# Patient Record
Sex: Male | Born: 1955 | Race: White | Hispanic: No | Marital: Married | Smoking: Never smoker
Health system: Southern US, Community
[De-identification: ages and names within clinical notes are randomized; demographics above are authoritative.]

## PROBLEM LIST (undated history)

## (undated) DIAGNOSIS — K219 Gastro-esophageal reflux disease without esophagitis: Secondary | ICD-10-CM

## (undated) DIAGNOSIS — Z789 Other specified health status: Secondary | ICD-10-CM

## (undated) HISTORY — DX: Other specified health status: Z78.9

---

## 2008-01-18 HISTORY — PX: FINGER SURGERY: SHX640

## 2008-10-04 ENCOUNTER — Emergency Department: Payer: Self-pay | Admitting: Emergency Medicine

## 2018-08-21 ENCOUNTER — Inpatient Hospital Stay
Admission: EM | Admit: 2018-08-21 | Discharge: 2018-08-27 | DRG: 372 | Disposition: A | Discharge status: Home / Self Care | Payer: No Typology Code available for payment source | Attending: General Surgery | Admitting: General Surgery

## 2018-08-21 ENCOUNTER — Inpatient Hospital Stay: Payer: No Typology Code available for payment source

## 2018-08-21 ENCOUNTER — Emergency Department: Payer: No Typology Code available for payment source

## 2018-08-21 ENCOUNTER — Encounter: Payer: Self-pay | Admitting: Emergency Medicine

## 2018-08-21 ENCOUNTER — Other Ambulatory Visit: Payer: Self-pay

## 2018-08-21 DIAGNOSIS — K567 Ileus, unspecified: Secondary | ICD-10-CM | POA: Diagnosis present

## 2018-08-21 DIAGNOSIS — K8020 Calculus of gallbladder without cholecystitis without obstruction: Secondary | ICD-10-CM | POA: Diagnosis present

## 2018-08-21 DIAGNOSIS — K529 Noninfective gastroenteritis and colitis, unspecified: Secondary | ICD-10-CM | POA: Diagnosis present

## 2018-08-21 DIAGNOSIS — R1030 Lower abdominal pain, unspecified: Secondary | ICD-10-CM | POA: Diagnosis present

## 2018-08-21 DIAGNOSIS — K3532 Acute appendicitis with perforation and localized peritonitis, without abscess: Secondary | ICD-10-CM | POA: Diagnosis not present

## 2018-08-21 DIAGNOSIS — K381 Appendicular concretions: Secondary | ICD-10-CM | POA: Diagnosis present

## 2018-08-21 DIAGNOSIS — K760 Fatty (change of) liver, not elsewhere classified: Secondary | ICD-10-CM | POA: Diagnosis present

## 2018-08-21 DIAGNOSIS — I2510 Atherosclerotic heart disease of native coronary artery without angina pectoris: Secondary | ICD-10-CM | POA: Diagnosis present

## 2018-08-21 DIAGNOSIS — Z20828 Contact with and (suspected) exposure to other viral communicable diseases: Secondary | ICD-10-CM | POA: Diagnosis present

## 2018-08-21 DIAGNOSIS — K3533 Acute appendicitis with perforation and localized peritonitis, with abscess: Secondary | ICD-10-CM | POA: Diagnosis present

## 2018-08-21 LAB — CBC WITH DIFFERENTIAL/PLATELET
Abs Immature Granulocytes: 0.04 10*3/uL (ref 0.00–0.07)
Basophils Absolute: 0 10*3/uL (ref 0.0–0.1)
Basophils Relative: 0 %
Eosinophils Absolute: 0 10*3/uL (ref 0.0–0.5)
Eosinophils Relative: 0 %
HCT: 50.9 % (ref 39.0–52.0)
Hemoglobin: 17.8 g/dL — ABNORMAL HIGH (ref 13.0–17.0)
Immature Granulocytes: 0 %
Lymphocytes Relative: 8 %
Lymphs Abs: 0.7 10*3/uL (ref 0.7–4.0)
MCH: 32.1 pg (ref 26.0–34.0)
MCHC: 35 g/dL (ref 30.0–36.0)
MCV: 91.9 fL (ref 80.0–100.0)
Monocytes Absolute: 0.5 10*3/uL (ref 0.1–1.0)
Monocytes Relative: 5 %
Neutro Abs: 8 10*3/uL — ABNORMAL HIGH (ref 1.7–7.7)
Neutrophils Relative %: 87 %
Platelets: 221 10*3/uL (ref 150–400)
RBC: 5.54 MIL/uL (ref 4.22–5.81)
RDW: 12.2 % (ref 11.5–15.5)
WBC: 9.3 10*3/uL (ref 4.0–10.5)
nRBC: 0 % (ref 0.0–0.2)

## 2018-08-21 LAB — URINALYSIS, COMPLETE (UACMP) WITH MICROSCOPIC
Bacteria, UA: NONE SEEN
Bilirubin Urine: NEGATIVE
Glucose, UA: NEGATIVE mg/dL
Hgb urine dipstick: NEGATIVE
Ketones, ur: 5 mg/dL — AB
Leukocytes,Ua: NEGATIVE
Nitrite: NEGATIVE
Protein, ur: 30 mg/dL — AB
Specific Gravity, Urine: >1.046 — ABNORMAL HIGH (ref 1.005–1.030)
pH: 5 (ref 5.0–8.0)

## 2018-08-21 LAB — COMPREHENSIVE METABOLIC PANEL
ALT: 26 U/L (ref 0–44)
AST: 22 U/L (ref 15–41)
Albumin: 4.2 g/dL (ref 3.5–5.0)
Alkaline Phosphatase: 54 U/L (ref 38–126)
Anion gap: 14 (ref 5–15)
BUN: 34 mg/dL — ABNORMAL HIGH (ref 8–23)
CO2: 26 mmol/L (ref 22–32)
Calcium: 9.3 mg/dL (ref 8.9–10.3)
Chloride: 91 mmol/L — ABNORMAL LOW (ref 98–111)
Creatinine, Ser: 1.05 mg/dL (ref 0.61–1.24)
GFR calc Af Amer: >60 mL/min (ref >60)
GFR calc non Af Amer: >60 mL/min (ref >60)
Glucose, Bld: 180 mg/dL — ABNORMAL HIGH (ref 70–99)
Potassium: 3.6 mmol/L (ref 3.5–5.1)
Sodium: 131 mmol/L — ABNORMAL LOW (ref 135–145)
Total Bilirubin: 1.5 mg/dL — ABNORMAL HIGH (ref 0.3–1.2)
Total Protein: 8.5 g/dL — ABNORMAL HIGH (ref 6.5–8.1)

## 2018-08-21 LAB — SARS CORONAVIRUS 2 BY RT PCR (HOSPITAL ORDER, PERFORMED IN ~~LOC~~ HOSPITAL LAB): SARS Coronavirus 2: NEGATIVE

## 2018-08-21 LAB — LIPASE, BLOOD: Lipase: 19 U/L (ref 11–51)

## 2018-08-21 MED ORDER — PANTOPRAZOLE SODIUM 40 MG IV SOLR
40.0000 mg | INTRAVENOUS | Status: DC
  Administered 2018-08-21 – 2018-08-23 (×3): 40 mg via INTRAVENOUS
  Filled 2018-08-21 (×3): qty 40

## 2018-08-21 MED ORDER — KETOROLAC TROMETHAMINE 30 MG/ML IJ SOLN
30.0000 mg | Freq: Four times a day (QID) | INTRAMUSCULAR | Status: AC
  Administered 2018-08-21 – 2018-08-26 (×17): 30 mg via INTRAVENOUS
  Filled 2018-08-21 (×18): qty 1

## 2018-08-21 MED ORDER — MIDAZOLAM HCL 5 MG/5ML IJ SOLN
INTRAMUSCULAR | Status: AC
  Filled 2018-08-21: qty 5

## 2018-08-21 MED ORDER — FENTANYL CITRATE (PF) 100 MCG/2ML IJ SOLN
INTRAMUSCULAR | Status: AC
  Filled 2018-08-21: qty 2

## 2018-08-21 MED ORDER — DEXTROSE IN LACTATED RINGERS 5 % IV SOLN
INTRAVENOUS | Status: DC
  Administered 2018-08-21 – 2018-08-24 (×8): via INTRAVENOUS

## 2018-08-21 MED ORDER — ENOXAPARIN SODIUM 40 MG/0.4ML ~~LOC~~ SOLN
40.0000 mg | SUBCUTANEOUS | Status: DC
  Administered 2018-08-22 – 2018-08-26 (×5): 40 mg via SUBCUTANEOUS
  Filled 2018-08-21 (×6): qty 0.4

## 2018-08-21 MED ORDER — PIPERACILLIN-TAZOBACTAM 3.375 G IVPB 30 MIN
3.3750 g | Freq: Once | INTRAVENOUS | Status: AC
  Administered 2018-08-21: 3.375 g via INTRAVENOUS
  Filled 2018-08-21: qty 50

## 2018-08-21 MED ORDER — ACETAMINOPHEN 650 MG RE SUPP
650.0000 mg | Freq: Four times a day (QID) | RECTAL | Status: DC | PRN
  Filled 2018-08-21: qty 1

## 2018-08-21 MED ORDER — SODIUM CHLORIDE 0.9 % IV BOLUS
1000.0000 mL | Freq: Once | INTRAVENOUS | Status: AC
  Administered 2018-08-21: 1000 mL via INTRAVENOUS

## 2018-08-21 MED ORDER — IOHEXOL 300 MG/ML  SOLN
100.0000 mL | Freq: Once | INTRAMUSCULAR | Status: AC | PRN
  Administered 2018-08-21: 05:00:00 100 mL via INTRAVENOUS

## 2018-08-21 MED ORDER — ACETAMINOPHEN 325 MG PO TABS
650.0000 mg | ORAL_TABLET | Freq: Four times a day (QID) | ORAL | Status: DC | PRN
  Administered 2018-08-21 – 2018-08-27 (×5): 650 mg via ORAL
  Filled 2018-08-21 (×5): qty 2

## 2018-08-21 MED ORDER — PIPERACILLIN-TAZOBACTAM 3.375 G IVPB
3.3750 g | Freq: Three times a day (TID) | INTRAVENOUS | Status: DC
  Administered 2018-08-21 – 2018-08-26 (×15): 3.375 g via INTRAVENOUS
  Filled 2018-08-21 (×17): qty 50

## 2018-08-21 MED ORDER — HYDROMORPHONE HCL 1 MG/ML IJ SOLN
0.5000 mg | INTRAMUSCULAR | Status: DC | PRN
  Administered 2018-08-22 – 2018-08-24 (×6): 0.5 mg via INTRAVENOUS
  Filled 2018-08-21 (×6): qty 0.5

## 2018-08-21 MED ORDER — FENTANYL CITRATE (PF) 100 MCG/2ML IJ SOLN
INTRAMUSCULAR | Status: DC | PRN
  Administered 2018-08-21 (×2): 50 ug via INTRAVENOUS

## 2018-08-21 MED ORDER — MIDAZOLAM HCL 2 MG/2ML IJ SOLN
INTRAMUSCULAR | Status: DC | PRN
  Administered 2018-08-21 (×2): 1 mg via INTRAVENOUS

## 2018-08-21 MED ORDER — ONDANSETRON HCL 4 MG/2ML IJ SOLN
4.0000 mg | Freq: Once | INTRAMUSCULAR | Status: AC
  Administered 2018-08-21: 4 mg via INTRAVENOUS
  Filled 2018-08-21: qty 2

## 2018-08-21 MED ORDER — KETOROLAC TROMETHAMINE 30 MG/ML IJ SOLN
30.0000 mg | Freq: Four times a day (QID) | INTRAMUSCULAR | Status: DC
  Administered 2018-08-21: 09:00:00 30 mg via INTRAVENOUS
  Filled 2018-08-21: qty 1

## 2018-08-21 MED ORDER — SODIUM CHLORIDE 0.9% FLUSH
5.0000 mL | Freq: Three times a day (TID) | INTRAVENOUS | Status: DC
  Administered 2018-08-21 – 2018-08-27 (×18): 5 mL

## 2018-08-21 MED ORDER — FENTANYL CITRATE (PF) 100 MCG/2ML IJ SOLN
50.0000 ug | Freq: Once | INTRAMUSCULAR | Status: AC
  Administered 2018-08-21: 05:00:00 50 ug via INTRAVENOUS
  Filled 2018-08-21: qty 2

## 2018-08-21 NOTE — H&P (Addendum)
[facility] SURGICAL HISTORY & PHYSICAL (cpt [phone])  HISTORY OF PRESENT ILLNESS (HPI):  63 y.o. male presented [facility] ED overnight for abdominal pain. Patient reports about a 3 day history of diffuse pain across his lower abdomen. He described this as being crampy in nature and rated it at the worst a 9 out of 10. Nothing seems to make the pain better. He endorses associate nausea, emesis, and decreased appetite with the pain. No fever, chills, chest pain, SOB, cough, congestion, or bladder/bowel changes. No history of similar pain in the past. No previous abdominal surgeries. Work up in the ED was concerning for perforated appendicitis with abscess and likely ileus.   General surgery is consulted by emergency medicine physician Dr Nita Sicklearolina Veronese, MD for evaluation and management of perforated appendicitis.    PAST MEDICAL HISTORY (PMH):  History reviewed. No pertinent past medical history.  Reviewed. Otherwise negative.   PAST SURGICAL HISTORY (PSH):  History reviewed. No pertinent surgical history.  Reviewed. Otherwise negative.   MEDICATIONS:  Prior to Admission medications   Not on File     ALLERGIES:  No Known Allergies   SOCIAL HISTORY:  Social History   Socioeconomic History  . Marital status: Married    Spouse name: Not on file  . Number of children: Not on file  . Years of education: Not on file  . Highest education level: Not on file  Occupational History  . Not on file  Social Needs  . Financial resource strain: Not on file  . Food insecurity    Worry: Not on file    Inability: Not on file  . Transportation needs    Medical: Not on file    Non-medical: Not on file  Tobacco Use  . Smoking status: Not on file  Substance and Sexual Activity  . Alcohol use: Not on file  . Drug use: Not on file  . Sexual activity: Not on file  Lifestyle  . Physical activity    Days per week: Not on file    Minutes per session: Not on file  . Stress: Not  on file  Relationships  . Social Musicianconnections    Talks on phone: Not on file    Gets together: Not on file    Attends religious service: Not on file    Active member of club or organization: Not on file    Attends meetings of clubs or organizations: Not on file    Relationship status: Not on file  . Intimate partner violence    Fear of current or ex partner: Not on file    Emotionally abused: Not on file    Physically abused: Not on file    Forced sexual activity: Not on file  Other Topics Concern  . Not on file  Social History Narrative  . Not on file     FAMILY HISTORY:  No family history on file.  Otherwise negative.   REVIEW OF SYSTEMS:  Review of Systems  Constitutional: Negative for chills and fever.  HENT: Negative for congestion and sore throat.   Respiratory: Negative for cough and shortness of breath.   Cardiovascular: Negative for chest pain and palpitations.  Gastrointestinal: Positive for abdominal pain, nausea and vomiting. Negative for blood in stool, constipation and diarrhea.  Genitourinary: Negative for dysuria and urgency.  Neurological: Negative for dizziness and headaches.  All other systems reviewed and are negative.   VITAL SIGNS:  Temp:  [97.8 F (36.6 C)] 97.8 F (36.6  C) (08/04 0442) Pulse Rate:  [91-116] 92 (08/04 0723) Resp:  [18-27] 18 (08/04 0723) BP: (132-136)/(74-90) 133/74 (08/04 0723) SpO2:  [93 %-100 %] 100 % (08/04 0723) Weight:  [95.3 kg] 95.3 kg (08/04 0443)     Height: 6\' 1"  (185.4 cm) Weight: 95.3 kg BMI (Calculated): 27.71   PHYSICAL EXAM:  Physical Exam Vitals signs and nursing note reviewed.  Constitutional:      General: He is not in acute distress.    Appearance: He is well-developed and normal weight. He is not ill-appearing or toxic-appearing.  HENT:     Head: Normocephalic and atraumatic.  Eyes:     General: No scleral icterus.    Extraocular Movements: Extraocular movements intact.  Cardiovascular:     Rate and  Rhythm: Normal rate and regular rhythm.     Heart sounds: Normal heart sounds. No murmur. No friction rub. No gallop.   Pulmonary:     Effort: Pulmonary effort is normal. No respiratory distress.     Breath sounds: Normal breath sounds. No wheezing.  Abdominal:     General: Abdomen is flat. Bowel sounds are normal. There is no distension.     Palpations: Abdomen is soft.     Tenderness: There is abdominal tenderness in the right lower quadrant. There is no guarding or rebound. Positive signs include McBurney's sign. Negative signs include Murphy's sign and Rovsing's sign.     Hernia: No hernia is present.     Comments: No peritonitis  Genitourinary:    Comments: Deferred Skin:    General: Skin is warm and dry.     Coloration: Skin is not jaundiced or pale.  Neurological:     General: No focal deficit present.     Mental Status: He is alert and oriented to person, place, and time.  Psychiatric:        Mood and Affect: Mood normal.        Behavior: Behavior normal.     INTAKE/OUTPUT:  This shift: No intake/output data recorded.  Last 2 shifts: @IOLAST2SHIFTS @  Labs:  CBC Latest Ref Rng & Units 08/21/2018  WBC 4.0 - 10.5 K/uL 9.3  Hemoglobin 13.0 - 17.0 g/dL 17.8(H)  Hematocrit 39.0 - 52.0 % 50.9  Platelets 150 - 400 K/uL 221   CMP Latest Ref Rng & Units 08/21/2018  Glucose 70 - 99 mg/dL 098(J180(H)  BUN 8 - 23 mg/dL 19(J34(H)  Creatinine 4.780.61 - 1.24 mg/dL 2.951.05  Sodium 621135 - 308145 mmol/L 131(L)  Potassium 3.5 - 5.1 mmol/L 3.6  Chloride 98 - 111 mmol/L 91(L)  CO2 22 - 32 mmol/L 26  Calcium 8.9 - 10.3 mg/dL 9.3  Total Protein 6.5 - 8.1 g/dL 6.5(H8.5(H)  Total Bilirubin 0.3 - 1.2 mg/dL 8.4(O1.5(H)  Alkaline Phos 38 - 126 U/L 54  AST 15 - 41 U/L 22  ALT 0 - 44 U/L 26    Imaging studies:   CT Abdomen/Pelvis (08/21/2018) personally reviewed showing appendicitis with adjacent abscess, small amount of free air, and small bowel dilation most likely ileus from infection. Radiologist report also  reviewed:   IMPRESSION: 1. Perforated appendicitis with small volume pneumoperitoneum in the right lower quadrant and 5 cm collection/abscess in the low pelvis. Appendicolith is present. 2. Secondary enteritis in the right lower quadrant with small bowel dilatation from partial obstruction or ileus. 3. Hepatic steatosis, cholelithiasis, and coronary atherosclerosis.   Assessment/Plan: (ICD-10's: K35.32, K56.7) 63 y.o. male with lower abdominal pain, nausea, and emesis attributable to perforated appendicitis with  5 cm adjacent abscess and likely ileus secondary to infection given small bowel dilation on imaging.   - Admit to general surgery   - NPO + IVF  - IV ABx (Zosyn)  - pain control prn; antiemetics prn  - Serial abdominal examinations  - He does not appear peritonitic or septic at this time. Will attempt conservative management of perforated appendicitis at this time. Patient and his wife are agreeable and understand if he clinically deteriorates he may require more emergent surgical intervention this admission.     - will ask IR to attempt percutaneous drainage of intra-abdominal abscess  - given ileus, if nausea/emesis become an issue we can place NGT; hold off for now.  - medical management of co-morbidities  - DVT prophylaxis; hold for potential IR procedure  All of the above findings and recommendations were discussed with the patient and his family, and all of their questions were answered to their expressed satisfaction.  -- Edison Simon, PA-C [facility] 08/21/2018, 7:31 AM [phone] M-F: 7am - 4pm   I saw and evaluated the patient. At the time of my exam, he was endorsing reflux symptoms. I added a PPI to his regimen. Still awaiting a floor bed. I agree with the above documentation, exam, and plan, which I have edited where appropriate. Fredirick Maudlin  1:24 PM

## 2018-08-21 NOTE — Procedures (Signed)
Interventional Radiology Procedure:   Indications: Perforated appendicitis with pelvic abscess  Procedure: CT guided abscess drain placement  Findings: Placed 10 Fr in pelvic abscess from left transgluteal approach.  Removed 25 ml of yellow purulent fluid.   Complications: None     EBL: less than 30 ml  Plan: Send fluid for culture and follow output.     Joseph Tolosa R. Anselm Pancoast, MD  Pager: [phone]

## 2018-08-21 NOTE — ED Notes (Signed)
ED TO INPATIENT HANDOFF REPORT  ED Nurse Name and Phone #: Ignatius Specking [phone]  S Name/Age/Gender Joseph Hodges 63 y.o. male Room/Bed: EDOTFA/EDOTF  Code Status   Code Status: Full Code  Home/SNF/Other Home Patient oriented to: self, place, time and situation Is this baseline? Yes   Triage Complete: Triage complete  Chief Complaint Vomiting Abd Pain  Triage Note Pt to triage via w/c with no distress noted, mask in place; pt reports lower abd pain accomp by N/V since Friday   Allergies No Known Allergies  Level of Care/Admitting Diagnosis ED Disposition    ED Disposition Condition [place]: [facility] [100120]  Level of Care: Med-Surg [16]  Covid Evaluation: Asymptomatic Screening Protocol (No Symptoms)  Diagnosis: Acute perforated appendicitis [867619]  Admitting Physician: Darreld Mclean  Attending Physician: Darreld Mclean  Estimated length of stay: 3 - 4 days  Certification:: I certify this patient will need inpatient services for at least 2 midnights  Bed request comments: 2C, if available  PT Class (Do Not Modify): Inpatient [101]  PT Acc Code (Do Not Modify): Private [1]       B Medical/Surgery History History reviewed. No pertinent past medical history. History reviewed. No pertinent surgical history.   A IV Location/Drains/Wounds Patient Lines/Drains/Airways Status   Active Line/Drains/Airways    Name:   Placement date:   Placement time:   Site:   Days:   Peripheral IV 08/21/18 Left Antecubital   08/21/18    0505    Antecubital   less than 1          Intake/Output Last 24 hours  Intake/Output Summary (Last 24 hours) at 08/21/2018 1442 Last data filed at 08/21/2018 1309 Gross per 24 hour  Intake no documentation  Output 200 ml  Net -200 ml    Labs/Imaging Results for orders placed or performed during the hospital encounter of 08/21/18 (from the past 48 hour(s))  CBC with  Differential     Status: Abnormal   Collection Time: 08/21/18  4:46 AM  Result Value Ref Range   WBC 9.3 4.0 - 10.5 K/uL   RBC 5.54 4.22 - 5.81 MIL/uL   Hemoglobin 17.8 (H) 13.0 - 17.0 g/dL   HCT 50.9 39.0 - 52.0 %   MCV 91.9 80.0 - 100.0 fL   MCH 32.1 26.0 - 34.0 pg   MCHC 35.0 30.0 - 36.0 g/dL   RDW 12.2 11.5 - 15.5 %   Platelets 221 150 - 400 K/uL   nRBC 0.0 0.0 - 0.2 %   Neutrophils Relative % 87 %   Neutro Abs 8.0 (H) 1.7 - 7.7 K/uL   Lymphocytes Relative 8 %   Lymphs Abs 0.7 0.7 - 4.0 K/uL   Monocytes Relative 5 %   Monocytes Absolute 0.5 0.1 - 1.0 K/uL   Eosinophils Relative 0 %   Eosinophils Absolute 0.0 0.0 - 0.5 K/uL   Basophils Relative 0 %   Basophils Absolute 0.0 0.0 - 0.1 K/uL   Immature Granulocytes 0 %   Abs Immature Granulocytes 0.04 0.00 - 0.07 K/uL    Comment: Performed at [facility], [address] [place], [place] [address]  Comprehensive metabolic panel     Status: Abnormal   Collection Time: 08/21/18  4:46 AM  Result Value Ref Range   Sodium 131 (L) 135 - 145 mmol/L   Potassium 3.6 3.5 - 5.1 mmol/L   Chloride 91 (L) 98 - 111 mmol/L  CO2 26 22 - 32 mmol/L   Glucose, Bld 180 (H) 70 - 99 mg/dL   BUN 34 (H) 8 - 23 mg/dL   Creatinine, Ser 1.611.05 0.61 - 1.24 mg/dL   Calcium 9.3 8.9 - 09.610.3 mg/dL   Total Protein 8.5 (H) 6.5 - 8.1 g/dL   Albumin 4.2 3.5 - 5.0 g/dL   AST 22 15 - 41 U/L   ALT 26 0 - 44 U/L   Alkaline Phosphatase 54 38 - 126 U/L   Total Bilirubin 1.5 (H) 0.3 - 1.2 mg/dL   GFR calc non Af Amer >60 >60 mL/min   GFR calc Af Amer >60 >60 mL/min   Anion gap 14 5 - 15    Comment: Performed at [facility], [address] [place], [place] [address]  Lipase, blood     Status: None   Collection Time: 08/21/18  4:46 AM  Result Value Ref Range   Lipase 19 11 - 51 U/L    Comment: Performed at [facility], [address] [place], [place] [address]  Urinalysis, Complete w Microscopic     Status: Abnormal    Collection Time: 08/21/18  4:46 AM  Result Value Ref Range   Color, Urine YELLOW (A) YELLOW   APPearance CLEAR (A) CLEAR   Specific Gravity, Urine >1.046 (H) 1.005 - 1.030   pH 5.0 5.0 - 8.0   Glucose, UA NEGATIVE NEGATIVE mg/dL   Hgb urine dipstick NEGATIVE NEGATIVE   Bilirubin Urine NEGATIVE NEGATIVE   Ketones, ur 5 (A) NEGATIVE mg/dL   Protein, ur 30 (A) NEGATIVE mg/dL   Nitrite NEGATIVE NEGATIVE   Leukocytes,Ua NEGATIVE NEGATIVE   RBC / HPF 0-5 0 - 5 RBC/hpf   WBC, UA 0-5 0 - 5 WBC/hpf   Bacteria, UA NONE SEEN NONE SEEN   Squamous Epithelial / LPF 0-5 0 - 5   Mucus PRESENT     Comment: Performed at [facility], [address] [place], [place] [address]  SARS Coronavirus 2 [facility](Hospital order, Performed in [facility] hospital lab) Nasopharyngeal Nasopharyngeal Swab     Status: None   Collection Time: 08/21/18  6:16 AM   Specimen: Nasopharyngeal Swab  Result Value Ref Range   SARS Coronavirus 2 NEGATIVE NEGATIVE    Comment: (NOTE) If result is NEGATIVE SARS-CoV-2 target nucleic acids are NOT DETECTED. The SARS-CoV-2 RNA is generally detectable in upper and lower  respiratory specimens during the acute phase of infection. The lowest  concentration of SARS-CoV-2 viral copies this assay can detect is 250  copies / mL. A negative result does not preclude SARS-CoV-2 infection  and should not be used as the sole basis for treatment or other  patient management decisions.  A negative result may occur with  improper specimen collection / handling, submission of specimen other  than nasopharyngeal swab, presence of viral mutation(s) within the  areas targeted by this assay, and inadequate number of viral copies  (<250 copies / mL). A negative result must be combined with clinical  observations, patient history, and epidemiological information. If result is POSITIVE SARS-CoV-2 target nucleic acids are DETECTED. The SARS-CoV-2 RNA is generally detectable in upper and lower   respiratory specimens dur ing the acute phase of infection.  Positive  results are indicative of active infection with SARS-CoV-2.  Clinical  correlation with patient history and other diagnostic information is  necessary to determine patient infection status.  Positive results do  not rule out bacterial infection or co-infection  with other viruses. If result is PRESUMPTIVE POSTIVE SARS-CoV-2 nucleic acids MAY BE PRESENT.   A presumptive positive result was obtained on the submitted specimen  and confirmed on repeat testing.  While 2019 novel coronavirus  (SARS-CoV-2) nucleic acids may be present in the submitted sample  additional confirmatory testing may be necessary for epidemiological  and / or clinical management purposes  to differentiate between  SARS-CoV-2 and other Sarbecovirus currently known to infect humans.  If clinically indicated additional testing with an alternate test  methodology [phone]([id number]) is advised. The SARS-CoV-2 RNA is generally  detectable in upper and lower respiratory sp ecimens during the acute  phase of infection. The expected result is Negative. Fact Sheet for Patients:  BoilerBrush.com.cyhttps://www.fda.gov/media/136312/download Fact Sheet for Healthcare Providers: https://pope.com/https://www.fda.gov/media/136313/download This test is not yet approved or cleared by the [place] FDA and has been authorized for detection and/or diagnosis of SARS-CoV-2 by FDA under an Emergency Use Authorization (EUA).  This EUA will remain in effect (meaning this test can be used) for the duration of the COVID-19 declaration under Section 564(b)(1) of the Act, 21 U.S.C. section 360bbb-3(b)(1), unless the authorization is terminated or revoked sooner. Performed at [facility], [address] [place], [place] [address]    Ct Abdomen Pelvis W Contrast  Result Date: 08/21/2018 CLINICAL DATA:  Unspecified abdominal pain EXAM: CT ABDOMEN AND PELVIS WITH CONTRAST TECHNIQUE: Multidetector CT  imaging of the abdomen and pelvis was performed using the standard protocol following bolus administration of intravenous contrast. CONTRAST:  100mL OMNIPAQUE IOHEXOL 300 MG/ML  SOLN COMPARISON:  None. FINDINGS: Lower chest: Small sliding hiatal hernia. Atelectasis in the lower lungs. Coronary atherosclerosis. Hepatobiliary: Hepatic steatosis.Cholelithiasis without signs of acute cholecystitis. Pancreas: Unremarkable. Spleen: Unremarkable. Adrenals/Urinary Tract: Negative adrenals. No hydronephrosis or stone. Unremarkable bladder. Stomach/Bowel: Appendicolith with distended appendix showing indistinct wall. There is mesoappendix inflammation. Outer wall appendiceal diameter is 12 mm. The appendix extends inferiorly from the cecum in the right lower quadrant. Small volume pneumoperitoneum in the right lower quadrant. There is a gas and fluid collection in the recto prosthetic recess measuring 5.3 cm in maximum. Right lower quadrant bowel loops show mild circumferential wall thickening and there is generalized small bowel distension which could be partial obstruction or reactive ileus. No defect is seen within the ileal wall to implicate a primary small bowel process. Vascular/Lymphatic: No acute vascular abnormality. No mass or adenopathy. Reproductive:Negative. Musculoskeletal: Degenerative disease without acute finding. These results were called by telephone at the time of interpretation on 08/21/2018 at 5:58 am to Dr. Nita SickleAROLINA VERONESE , who verbally acknowledged these results. IMPRESSION: 1. Perforated appendicitis with small volume pneumoperitoneum in the right lower quadrant and 5 cm collection/abscess in the low pelvis. Appendicolith is present. 2. Secondary enteritis in the right lower quadrant with small bowel dilatation from partial obstruction or ileus. 3. Hepatic steatosis, cholelithiasis, and coronary atherosclerosis. Electronically Signed   By: Marnee SpringJonathon  Watts M.D.   On: 08/21/2018 06:01    Pending  Labs Unresulted Labs (From admission, onward)    Start     Ordered   08/28/18 0500  Creatinine, serum  (enoxaparin (LOVENOX)    CrCl >/= 30 ml/min)  Weekly,   STAT    Comments: while on enoxaparin therapy    08/21/18 0645   08/22/18 0500  Basic metabolic panel  Tomorrow morning,   STAT     08/21/18 0645   08/22/18 0500  Magnesium  Tomorrow morning,   STAT     08/21/18 0645  08/22/18 0500  Phosphorus  Tomorrow morning,   STAT     08/21/18 0645   08/22/18 0500  CBC  Tomorrow morning,   STAT     08/21/18 0645   08/21/18 0642  HIV antibody (Routine Testing)  Once,   STAT     08/21/18 0645          Vitals/Pain Today's Vitals   08/21/18 1416 08/21/18 1422 08/21/18 1427 08/21/18 1432  BP: 125/86 126/88 124/90 125/88  Pulse: 82 90 88 88  Resp: (Abnormal) 32 (Abnormal) 34 (Abnormal) 27 (Abnormal) 24  Temp:      SpO2: 94% 97% 95% 94%  Weight:      Height:      PainSc:        Isolation Precautions No active isolations  Medications Medications  enoxaparin (LOVENOX) injection 40 mg (0 mg Subcutaneous Hold 08/21/18 0749)  dextrose 5 % in lactated ringers infusion ( Intravenous New Bag/Given 08/21/18 0837)  piperacillin-tazobactam (ZOSYN) IVPB 3.375 g (has no administration in time range)  acetaminophen (TYLENOL) tablet 650 mg (has no administration in time range)    Or  acetaminophen (TYLENOL) suppository 650 mg (has no administration in time range)  HYDROmorphone (DILAUDID) injection 0.5 mg (has no administration in time range)  pantoprazole (PROTONIX) injection 40 mg (40 mg Intravenous Given 08/21/18 1337)  ketorolac (TORADOL) 30 MG/ML injection 30 mg (has no administration in time range)  fentaNYL (SUBLIMAZE) 100 MCG/2ML injection (has no administration in time range)  midazolam (VERSED) 5 MG/5ML injection (has no administration in time range)  midazolam (VERSED) injection (1 mg Intravenous Given 08/21/18 1422)  fentaNYL (SUBLIMAZE) injection (50 mcg Intravenous Given 08/21/18 1422)   fentaNYL (SUBLIMAZE) injection 50 mcg (50 mcg Intravenous Given 08/21/18 0513)  ondansetron (ZOFRAN) injection 4 mg (4 mg Intravenous Given 08/21/18 0513)  sodium chloride 0.9 % bolus 1,000 mL (0 mLs Intravenous Stopped 08/21/18 0833)  iohexol (OMNIPAQUE) 300 MG/ML solution 100 mL (100 mLs Intravenous Contrast Given 08/21/18 0526)  piperacillin-tazobactam (ZOSYN) IVPB 3.375 g (0 g Intravenous Stopped 08/21/18 0659)    Mobility walks Low fall risk   Focused Assessments Cardiac Assessment Handoff:  Cardiac Rhythm: Normal sinus rhythm No results found for: CKTOTAL, CKMB, CKMBINDEX, TROPONINI No results found for: DDIMER Does the Patient currently have chest pain? No      R Recommendations: See Admitting Provider Note  Report given to:   Additional Notes: 0

## 2018-08-21 NOTE — ED Notes (Signed)
ED Provider at bedside. 

## 2018-08-21 NOTE — ED Notes (Signed)
Pt 1400 dose of zosyn sent with the pt for his procedure in specials.

## 2018-08-21 NOTE — Consult Note (Signed)
Chief Complaint: Patient was seen in consultation today for pelvic abscess aspiration/possible drain placement  Referring Physician(s): Lynden OxfordZachary Schulz, PA-C  Supervising Physician: Richarda OverlieHenn, Adam  Patient Status: [facility] - ED  History of Present Illness: Joseph QuailDarren Hodges is a 63 y.o. male with no significant past medical history who presented to [facility] ED early this morning with complaints of abdominal pain, nausea and vomiting x3 days. Initial workup in the ED showed patient afebrile without leukocytosis, COVID (-). CT abd/pelvis with contrast showed a perforated appendicitis with small volume pneumoperitoneum in the right lower quadrant and a 5 cm collection/absces in the low pelvis. General surgery was consulted who recommended IV fluid and antibiotics as well as IR consult for possible aspiration and drain placement of the pelvic fluid collection.  Patient laying in ED bed during exam, appears comfortable. Wife is present during exam. He reports he is overall very healthy, has no history of any chronic medical issues and only takes over the counter medications for headaches/muscle pain which is infrequent. He has not seen a PCP in quite awhile but plans to establish care once he is out of the hospital. He tells me he began having lower abdominal pain which was cramping/gas-like in nature on Friday. Shortly after developing abdominal pain he began to experience intermittent nausea and vomiting, he denies noting any blood in his vomit. He states that he has been trying to eat and drink however he is unable to keep anything down. He did have a small BM over the weekend, he denies melena or hematochezia. He is unsure if he has had a fever because he has been taking Tylenol around the clock to help with abdominal pain. He states he was told about possibly having a drain placed today. He and his wife are wondering when he will be able to go to a room on the floor, how long he will have to stay in the hospital  and how to care for a drain once he leaves the hospital.  History reviewed. No pertinent past medical history.  History reviewed. No pertinent surgical history.  Allergies: Patient has no known allergies.  Medications: Prior to Admission medications   Not on File     No family history on file.  Social History   Socioeconomic History   Marital status: Married    Spouse name: Not on file   Number of children: Not on file   Years of education: Not on file   Highest education level: Not on file  Occupational History   Not on file  Social Needs   Financial resource strain: Not on file   Food insecurity    Worry: Not on file    Inability: Not on file   Transportation needs    Medical: Not on file    Non-medical: Not on file  Tobacco Use   Smoking status: Not on file  Substance and Sexual Activity   Alcohol use: Not on file   Drug use: Not on file   Sexual activity: Not on file  Lifestyle   Physical activity    Days per week: Not on file    Minutes per session: Not on file   Stress: Not on file  Relationships   Social connections    Talks on phone: Not on file    Gets together: Not on file    Attends religious service: Not on file    Active member of club or organization: Not on file    Attends meetings of  clubs or organizations: Not on file    Relationship status: Not on file  Other Topics Concern   Not on file  Social History Narrative   Not on file     Review of Systems: A 12 point ROS discussed and pertinent positives are indicated in the HPI above.  All other systems are negative.  Review of Systems  Constitutional: Positive for appetite change. Negative for chills.  Respiratory: Negative for cough and shortness of breath.   Cardiovascular: Negative for chest pain and leg swelling.  Gastrointestinal: Positive for abdominal pain, nausea and vomiting. Negative for blood in stool, constipation and diarrhea.  Genitourinary: Negative for  hematuria.  Musculoskeletal: Negative for back pain.  Skin: Negative for rash.  Neurological: Positive for dizziness (when standing too fast and after receiving pain medication in ED). Negative for syncope and headaches.    Vital Signs: BP 133/74    Pulse 92    Temp 97.8 F (36.6 C)    Resp 18    Ht 6\' 1"  (1.854 m)    Wt 210 lb (95.3 kg)    SpO2 100%    BMI 27.71 kg/m   Physical Exam Vitals signs and nursing note reviewed.  Constitutional:      General: He is not in acute distress. HENT:     Head: Normocephalic.  Cardiovascular:     Rate and Rhythm: Normal rate and regular rhythm.  Pulmonary:     Effort: Pulmonary effort is normal.     Breath sounds: Normal breath sounds.  Abdominal:     General: There is no distension.     Palpations: Abdomen is soft.     Tenderness: There is abdominal tenderness (RLQ, RUQ, midline). There is no guarding.     Comments: Tense but soft abdomen. Decreased bowel sounds  Skin:    General: Skin is warm and dry.  Neurological:     Mental Status: He is alert and oriented to person, place, and time.  Psychiatric:        Mood and Affect: Mood normal.        Behavior: Behavior normal.        Thought Content: Thought content normal.        Judgment: Judgment normal.      MD Evaluation Airway: WNL Heart: WNL Abdomen: WNL Chest/ Lungs: WNL ASA  Classification: 2 Mallampati/Airway Score: Two   Imaging: Ct Abdomen Pelvis W Contrast  Result Date: 08/21/2018 CLINICAL DATA:  Unspecified abdominal pain EXAM: CT ABDOMEN AND PELVIS WITH CONTRAST TECHNIQUE: Multidetector CT imaging of the abdomen and pelvis was performed using the standard protocol following bolus administration of intravenous contrast. CONTRAST:  149mL OMNIPAQUE IOHEXOL 300 MG/ML  SOLN COMPARISON:  None. FINDINGS: Lower chest: Small sliding hiatal hernia. Atelectasis in the lower lungs. Coronary atherosclerosis. Hepatobiliary: Hepatic steatosis.Cholelithiasis without signs of acute  cholecystitis. Pancreas: Unremarkable. Spleen: Unremarkable. Adrenals/Urinary Tract: Negative adrenals. No hydronephrosis or stone. Unremarkable bladder. Stomach/Bowel: Appendicolith with distended appendix showing indistinct wall. There is mesoappendix inflammation. Outer wall appendiceal diameter is 12 mm. The appendix extends inferiorly from the cecum in the right lower quadrant. Small volume pneumoperitoneum in the right lower quadrant. There is a gas and fluid collection in the recto prosthetic recess measuring 5.3 cm in maximum. Right lower quadrant bowel loops show mild circumferential wall thickening and there is generalized small bowel distension which could be partial obstruction or reactive ileus. No defect is seen within the ileal wall to implicate a primary small bowel process. Vascular/Lymphatic:  No acute vascular abnormality. No mass or adenopathy. Reproductive:Negative. Musculoskeletal: Degenerative disease without acute finding. These results were called by telephone at the time of interpretation on 08/21/2018 at 5:58 am to Dr. Nita SickleAROLINA VERONESE , who verbally acknowledged these results. IMPRESSION: 1. Perforated appendicitis with small volume pneumoperitoneum in the right lower quadrant and 5 cm collection/abscess in the low pelvis. Appendicolith is present. 2. Secondary enteritis in the right lower quadrant with small bowel dilatation from partial obstruction or ileus. 3. Hepatic steatosis, cholelithiasis, and coronary atherosclerosis. Electronically Signed   By: Marnee SpringJonathon  Watts M.D.   On: 08/21/2018 06:01    Labs:  CBC: Recent Labs    08/21/18 0446  WBC 9.3  HGB 17.8*  HCT 50.9  PLT 221    COAGS: No results for input(s): INR, APTT in the last 8760 hours.  BMP: Recent Labs    08/21/18 0446  NA 131*  K 3.6  CL 91*  CO2 26  GLUCOSE 180*  BUN 34*  CALCIUM 9.3  CREATININE 1.05  GFRNONAA >60  GFRAA >60    LIVER FUNCTION TESTS: Recent Labs    08/21/18 0446  BILITOT  1.5*  AST 22  ALT 26  ALKPHOS 54  PROT 8.5*  ALBUMIN 4.2    TUMOR MARKERS: No results for input(s): AFPTM, CEA, CA199, CHROMGRNA in the last 8760 hours.  Assessment and Plan:   63 y/o M with no past medical history who presented to [facility] ED early this morning with c/o abdominal pain, nausea and vomiting which began Friday. CT abd/pelvis shows perforated appendicitis and a 5 cm fluid collection/abscess in the low pelvis - IR has been consulted for possible aspiration/drain placement within this fluid collection. Patient has been reviewed by Dr. Lowella DandyHenn who approves procedure for later today.  Patient reports a few sips of water upon arrival to the ED around 4:30 am today, no other PO intake besides that. He does not take any medications. Afebrile, WBC 9.3, hgb 17.8, plt 221.  Risks and benefits discussed with the patient including bleeding, infection, damage to adjacent structures, bowel perforation/fistula connection, and sepsis.  All of the patient's questions were answered, patient is agreeable to proceed.  Consent signed and in IR.  Thank you for this interesting consult.  I greatly enjoyed meeting Development worker, communityDarren Sinquefield and look forward to participating in their care.  A copy of this report was sent to the requesting provider on this date.  Electronically Signed: Villa HerbShannon A Amman Bartel, PA-C 08/21/2018, 9:37 AM   I spent a total of 40 Minutes  in face to face in clinical consultation, greater than 50% of which was counseling/coordinating care for pelvic abscess aspiration/possible drain placement.

## 2018-08-21 NOTE — ED Notes (Signed)
Patient states having lower abdominal pain 9/10. Per patient pain started on lower right side. During palpation patient tender across lower abdominal area.

## 2018-08-21 NOTE — ED Provider Notes (Signed)
[facility] Emergency Department Provider Note  ____________________________________________  Time seen: Approximately 5:05 AM  I have reviewed the triage vital signs and the nursing notes.   HISTORY  Chief Complaint Abdominal Pain   HPI Joseph Hodges is a 63 y.o. male with no significant past medical history who presents for evaluation of 3 days of abdominal pain, nausea and vomiting.  Patient is complaining of crampy 9 out of 10 pain located in the lower abdominal region associated with several daily episodes of nonbloody nonbilious emesis.  Patient has been unable to keep anything down.  No diarrhea.  Has had normal bowel movements.  No fever.  No dysuria or hematuria.  No chest pain, shortness of breath, or cough.  Patient denies any prior abdominal surgeries.  Denies any similar pain in the past.   PMH None - reviewed   Allergies Patient has no known allergies.  No family history on file.  Social History Smoking - no Alcohol - no Drugs - no  Review of Systems  Constitutional: Negative for fever. Eyes: Negative for visual changes. ENT: Negative for sore throat. Neck: No neck pain  Cardiovascular: Negative for chest pain. Respiratory: Negative for shortness of breath. Gastrointestinal: + lower abdominal pain, nausea, and vomiting. No diarrhea. Genitourinary: Negative for dysuria. Musculoskeletal: Negative for back pain. Skin: Negative for rash. Neurological: Negative for headaches, weakness or numbness. Psych: No SI or HI  ____________________________________________   PHYSICAL EXAM:  VITAL SIGNS: ED Triage Vitals  Enc Vitals Group     BP 08/21/18 0442 132/84     Pulse Rate 08/21/18 0442 (!) 116     Resp 08/21/18 0442 18     Temp 08/21/18 0442 97.8 F (36.6 C)     Temp src --      SpO2 08/21/18 0442 93 %     Weight 08/21/18 0443 210 lb (95.3 kg)     Height 08/21/18 0443 6\' 1"  (1.854 m)     Head Circumference --      Peak  Flow --      Pain Score 08/21/18 0443 9     Pain Loc --      Pain Edu? --      Excl. in GC? --     Constitutional: Alert and oriented. Well appearing and in no apparent distress. HEENT:      Head: Normocephalic and atraumatic.         Eyes: Conjunctivae are normal. Sclera is non-icteric.       Mouth/Throat: Mucous membranes are dry.       Neck: Supple with no signs of meningismus. Cardiovascular: Tachycardic with regular rhythm. No murmurs, gallops, or rubs. 2+ symmetrical distal pulses are present in all extremities. No JVD. Respiratory: Normal respiratory effort. Lungs are clear to auscultation bilaterally. No wheezes, crackles, or rhonchi.  Gastrointestinal: Soft, mildly diffuse tenderness throughout, and non distended with positive bowel sounds. No rebound or guarding. Genitourinary: No CVA tenderness. Musculoskeletal: Nontender with normal range of motion in all extremities. No edema, cyanosis, or erythema of extremities. Neurologic: Normal speech and language. Face is symmetric. Moving all extremities. No gross focal neurologic deficits are appreciated. Skin: Skin is warm, dry and intact. No rash noted. Psychiatric: Mood and affect are normal. Speech and behavior are normal.  ____________________________________________   LABS (all labs ordered are listed, but only abnormal results are displayed)  Labs Reviewed  CBC WITH DIFFERENTIAL/PLATELET - Abnormal; Notable for the following components:      Result  Value   Hemoglobin 17.8 (*)    Neutro Abs 8.0 (*)    All other components within normal limits  COMPREHENSIVE METABOLIC PANEL - Abnormal; Notable for the following components:   Sodium 131 (*)    Chloride 91 (*)    Glucose, Bld 180 (*)    BUN 34 (*)    Total Protein 8.5 (*)    Total Bilirubin 1.5 (*)    All other components within normal limits  SARS CORONAVIRUS 2 (HOSPITAL ORDER, [place] [facility])  LIPASE, BLOOD  URINALYSIS, COMPLETE (UACMP) WITH  MICROSCOPIC  HIV ANTIBODY (ROUTINE TESTING W REFLEX)  CBC  CREATININE, SERUM   ____________________________________________  EKG  ED ECG REPORT I, Rudene Re, the attending physician, personally viewed and interpreted this ECG.  Normal sinus rhythm, rate of 92, normal intervals, normal axis, no ST elevations or depressions. ____________________________________________  RADIOLOGY  I have personally reviewed the images performed during this visit and I agree with the Radiologist's read.   Interpretation by Radiologist:  Ct Abdomen Pelvis W Contrast  Result Date: 08/21/2018 CLINICAL DATA:  Unspecified abdominal pain EXAM: CT ABDOMEN AND PELVIS WITH CONTRAST TECHNIQUE: Multidetector CT imaging of the abdomen and pelvis was performed using the standard protocol following bolus administration of intravenous contrast. CONTRAST:  176mL OMNIPAQUE IOHEXOL 300 MG/ML  SOLN COMPARISON:  None. FINDINGS: Lower chest: Small sliding hiatal hernia. Atelectasis in the lower lungs. Coronary atherosclerosis. Hepatobiliary: Hepatic steatosis.Cholelithiasis without signs of acute cholecystitis. Pancreas: Unremarkable. Spleen: Unremarkable. Adrenals/Urinary Tract: Negative adrenals. No hydronephrosis or stone. Unremarkable bladder. Stomach/Bowel: Appendicolith with distended appendix showing indistinct wall. There is mesoappendix inflammation. Outer wall appendiceal diameter is 12 mm. The appendix extends inferiorly from the cecum in the right lower quadrant. Small volume pneumoperitoneum in the right lower quadrant. There is a gas and fluid collection in the recto prosthetic recess measuring 5.3 cm in maximum. Right lower quadrant bowel loops show mild circumferential wall thickening and there is generalized small bowel distension which could be partial obstruction or reactive ileus. No defect is seen within the ileal wall to implicate a primary small bowel process. Vascular/Lymphatic: No acute vascular  abnormality. No mass or adenopathy. Reproductive:Negative. Musculoskeletal: Degenerative disease without acute finding. These results were called by telephone at the time of interpretation on 08/21/2018 at 5:58 am to Dr. Rudene Re , who verbally acknowledged these results. IMPRESSION: 1. Perforated appendicitis with small volume pneumoperitoneum in the right lower quadrant and 5 cm collection/abscess in the low pelvis. Appendicolith is present. 2. Secondary enteritis in the right lower quadrant with small bowel dilatation from partial obstruction or ileus. 3. Hepatic steatosis, cholelithiasis, and coronary atherosclerosis. Electronically Signed   By: Monte Fantasia M.D.   On: 08/21/2018 06:01      ____________________________________________   PROCEDURES  Procedure(s) performed: None Procedures Critical Care performed:  Yes  CRITICAL CARE Performed by: Rudene Re  ?  Total critical care time: 30 min  Critical care time was exclusive of separately billable procedures and treating other patients.  Critical care was necessary to treat or prevent imminent or life-threatening deterioration.  Critical care was time spent personally by me on the following activities: development of treatment plan with patient and/or surrogate as well as nursing, discussions with consultants, evaluation of patient's response to treatment, examination of patient, obtaining history from patient or surrogate, ordering and performing treatments and interventions, ordering and review of laboratory studies, ordering and review of radiographic studies, pulse oximetry and re-evaluation of patient's condition.  ____________________________________________   INITIAL IMPRESSION / ASSESSMENT AND PLAN / ED COURSE  63 y.o. male with no significant past medical history who presents for evaluation of 3 days of abdominal pain, nausea and vomiting.  Patient is well-appearing, looks dry on exam, afebrile, slightly  tachycardic with a pulse of 116, abdomen has mild diffuse tenderness with no localized tenderness, rebound or guarding, no distention, positive bowel sounds.  Differential diagnosis includes diverticulitis versus appendicitis versus SBO versus gallbladder disease versus pancreatitis.  Will give IV fluids, fentanyl, Zofran, will check labs, urinalysis, and get a CT abdomen pelvis.    _________________________ 6:03 AM on 08/21/2018 -----------------------------------------  CT concerning for perforated appendicitis with small abscess and free air. Will give zosyn. Discussed with Dr. Lady Garyannon for surgical intervention    As part of my medical decision making, I reviewed the following data within the electronic MEDICAL RECORD NUMBER Nursing notes reviewed and incorporated, Labs reviewed , Old chart reviewed, Radiograph reviewed , Discussed with admitting physician , Notes from prior ED visits and [place] Controlled Substance Database   Patient was evaluated in Emergency Department today for the symptoms described in the history of present illness. Patient was evaluated in the context of the global COVID-19 pandemic, which necessitated consideration that the patient might be at risk for infection with the SARS-CoV-2 virus that causes COVID-19. Institutional protocols and algorithms that pertain to the evaluation of patients at risk for COVID-19 are in a state of rapid change based on information released by regulatory bodies including the CDC and federal and state organizations. These policies and algorithms were followed during the patient's care in the ED.   ____________________________________________   FINAL CLINICAL IMPRESSION(S) / ED DIAGNOSES   Final diagnoses:  Acute perforated appendicitis      NEW MEDICATIONS STARTED DURING THIS VISIT:  ED Discharge Orders    None       Note:  This document was prepared using Dragon voice recognition software and may include unintentional dictation  errors.    Don PerkingVeronese, [place], MD 08/21/18 [phone]

## 2018-08-21 NOTE — ED Triage Notes (Signed)
Pt to triage via w/c with no distress noted, mask in place; pt reports lower abd pain accomp by N/V since Friday

## 2018-08-21 NOTE — Progress Notes (Signed)
Per surgery PA okay for RN to change diet order from Npo to clear liquids.

## 2018-08-22 LAB — CBC
HCT: 44.4 % (ref 39.0–52.0)
Hemoglobin: 15.3 g/dL (ref 13.0–17.0)
MCH: 32.8 pg (ref 26.0–34.0)
MCHC: 34.5 g/dL (ref 30.0–36.0)
MCV: 95.1 fL (ref 80.0–100.0)
Platelets: 181 10*3/uL (ref 150–400)
RBC: 4.67 MIL/uL (ref 4.22–5.81)
RDW: 12.5 % (ref 11.5–15.5)
WBC: 5 10*3/uL (ref 4.0–10.5)
nRBC: 0 % (ref 0.0–0.2)

## 2018-08-22 LAB — BASIC METABOLIC PANEL
Anion gap: 12 (ref 5–15)
BUN: 40 mg/dL — ABNORMAL HIGH (ref 8–23)
CO2: 25 mmol/L (ref 22–32)
Calcium: 8.2 mg/dL — ABNORMAL LOW (ref 8.9–10.3)
Chloride: 96 mmol/L — ABNORMAL LOW (ref 98–111)
Creatinine, Ser: 1.18 mg/dL (ref 0.61–1.24)
GFR calc Af Amer: >60 mL/min (ref >60)
GFR calc non Af Amer: >60 mL/min (ref >60)
Glucose, Bld: 128 mg/dL — ABNORMAL HIGH (ref 70–99)
Potassium: 4 mmol/L (ref 3.5–5.1)
Sodium: 133 mmol/L — ABNORMAL LOW (ref 135–145)

## 2018-08-22 LAB — HIV ANTIBODY (ROUTINE TESTING W REFLEX): HIV Screen 4th Generation wRfx: NONREACTIVE

## 2018-08-22 LAB — MAGNESIUM: Magnesium: 2.5 mg/dL — ABNORMAL HIGH (ref 1.7–2.4)

## 2018-08-22 LAB — PHOSPHORUS: Phosphorus: 2.6 mg/dL (ref 2.5–4.6)

## 2018-08-22 MED ORDER — ONDANSETRON HCL 4 MG/2ML IJ SOLN
4.0000 mg | Freq: Four times a day (QID) | INTRAMUSCULAR | Status: DC | PRN
  Administered 2018-08-22 (×2): 4 mg via INTRAVENOUS
  Filled 2018-08-22 (×2): qty 2

## 2018-08-22 NOTE — H&P (Signed)
[facility] SURGICAL HISTORY & PHYSICAL  HISTORY OF PRESENT ILLNESS (HPI):  63 y.o. male presented to [facility] ED Monday night for abdominal pain. Patient reports about a 3 day history of diffuse pain across his lower abdomen. He described this as being crampy in nature and rated it at the worst a 9 out of 10. Nothing seems to make the pain better. He endorses associate nausea, emesis, and decreased appetite with the pain. No fever, chills, chest pain, SOB, cough, congestion, or bladder/bowel changes. No history of similar pain in the past. No previous abdominal surgeries. Work up in the ED was concerning for perforated appendicitis with abscess and likely ileus.   Interval history: IR placed drain into abscess cavity.  Polymicrobial Gram stain.  Patient tolerating clear liquid diet, having loose stools.  Pain improved.    PAST MEDICAL HISTORY ([facility]):  History reviewed. No pertinent past medical history.  Reviewed. Otherwise negative.   PAST SURGICAL HISTORY ([place]):  History reviewed. No pertinent surgical history.  Reviewed. Otherwise negative.   MEDICATIONS:  Prior to Admission medications   Not on File     ALLERGIES:  No Known Allergies   SOCIAL HISTORY:  Social History   Socioeconomic History  . Marital status: Married    Spouse name: Not on file  . Number of children: Not on file  . Years of education: Not on file  . Highest education level: Not on file  Occupational History  . Not on file  Social Needs  . Financial resource strain: Not on file  . Food insecurity    Worry: Not on file    Inability: Not on file  . Transportation needs    Medical: Not on file    Non-medical: Not on file  Tobacco Use  . Smoking status: Never Smoker  . Smokeless tobacco: Never Used  Substance and Sexual Activity  . Alcohol use: Not on file  . Drug use: Not on file  . Sexual activity: Not on file  Lifestyle  . Physical activity    Days per week: Not on file    Minutes  per session: Not on file  . Stress: Not on file  Relationships  . Social Herbalist on phone: Not on file    Gets together: Not on file    Attends religious service: Not on file    Active member of club or organization: Not on file    Attends meetings of clubs or organizations: Not on file    Relationship status: Not on file  . Intimate partner violence    Fear of current or ex partner: Not on file    Emotionally abused: Not on file    Physically abused: Not on file    Forced sexual activity: Not on file  Other Topics Concern  . Not on file  Social History Narrative  . Not on file     FAMILY HISTORY:  No family history on file.  Otherwise negative.   REVIEW OF SYSTEMS:  Review of Systems  Constitutional: Negative for chills and fever.  HENT: Negative for congestion and sore throat.   Respiratory: Negative for cough and shortness of breath.   Cardiovascular: Negative for chest pain and palpitations.  Gastrointestinal: Positive for diarrhea. Negative for abdominal pain, blood in stool, constipation, nausea and vomiting.  Genitourinary: Negative for dysuria and urgency.  Neurological: Negative for dizziness and headaches.  All other systems reviewed and are negative.   VITAL SIGNS:  Temp:  [  98 F (36.7 C)-100.4 F (38 C)] 98 F (36.7 C) (08/05 0546) Pulse Rate:  [76-96] 76 (08/05 0546) Resp:  [16-34] 18 (08/05 0546) BP: (110-141)/(70-92) 110/74 (08/05 0546) SpO2:  [92 %-100 %] 100 % (08/05 0546)     Height: 6\' 1"  (185.4 cm) Weight: 95.3 kg BMI (Calculated): 27.71   PHYSICAL EXAM:  Physical Exam Vitals signs and nursing note reviewed.  Constitutional:      General: He is not in acute distress.    Appearance: He is well-developed and normal weight. He is not ill-appearing or toxic-appearing.  HENT:     Head: Normocephalic and atraumatic.  Eyes:     General: No scleral icterus.    Extraocular Movements: Extraocular movements intact.  Cardiovascular:      Rate and Rhythm: Normal rate and regular rhythm.     Heart sounds: Normal heart sounds. No murmur. No friction rub. No gallop.   Pulmonary:     Effort: Pulmonary effort is normal. No respiratory distress.     Breath sounds: Normal breath sounds. No wheezing.  Abdominal:     General: Abdomen is flat. Bowel sounds are normal. There is no distension.     Palpations: Abdomen is soft.     Tenderness: There is no abdominal tenderness. There is no guarding or rebound. Negative signs include Murphy's sign and Rovsing's sign.     Hernia: No hernia is present.     Comments: No peritonitis Drain with slightly murky serous fluid  Genitourinary:    Comments: Deferred Skin:    General: Skin is warm and dry.     Coloration: Skin is not jaundiced or pale.  Neurological:     General: No focal deficit present.     Mental Status: He is alert and oriented to person, place, and time.  Psychiatric:        Mood and Affect: Mood normal.        Behavior: Behavior normal.     INTAKE/OUTPUT:  This shift: No intake/output data recorded.  Last 2 shifts: @IOLAST2SHIFTS @  Labs:  CBC Latest Ref Rng & Units 08/22/2018 08/21/2018  WBC 4.0 - 10.5 K/uL 5.0 9.3  Hemoglobin 13.0 - 17.0 g/dL 96.015.3 17.8(H)  Hematocrit 39.0 - 52.0 % 44.4 50.9  Platelets 150 - 400 K/uL 181 221   CMP Latest Ref Rng & Units 08/22/2018 08/21/2018  Glucose 70 - 99 mg/dL 454(U128(H) 981(X180(H)  BUN 8 - 23 mg/dL 91(Y40(H) 78(G34(H)  Creatinine 0.61 - 1.24 mg/dL 9.561.18 2.131.05  Sodium 086135 - 145 mmol/L 133(L) 131(L)  Potassium 3.5 - 5.1 mmol/L 4.0 3.6  Chloride 98 - 111 mmol/L 96(L) 91(L)  CO2 22 - 32 mmol/L 25 26  Calcium 8.9 - 10.3 mg/dL 8.2(L) 9.3  Total Protein 6.5 - 8.1 g/dL - 8.5(H)  Total Bilirubin 0.3 - 1.2 mg/dL - 1.5(H)  Alkaline Phos 38 - 126 U/L - 54  AST 15 - 41 U/L - 22  ALT 0 - 44 U/L - 26    Imaging studies:   CT Abdomen/Pelvis (08/21/2018) personally reviewed showing appendicitis with adjacent abscess, small amount of free air, and small  bowel dilation most likely ileus from infection. Radiologist report also reviewed:   IMPRESSION: 1. Perforated appendicitis with small volume pneumoperitoneum in the right lower quadrant and 5 cm collection/abscess in the low pelvis. Appendicolith is present. 2. Secondary enteritis in the right lower quadrant with small bowel dilatation from partial obstruction or ileus. 3. Hepatic steatosis, cholelithiasis, and coronary atherosclerosis.   Assessment/Plan: (  ICD-10's: K35.32, 44K56.7) 63 y.o. male with lower abdominal pain, nausea, and emesis attributable to perforated appendicitis with 5 cm adjacent abscess and likely ileus secondary to infection given small bowel dilation on imaging.   - advance diet to full liquid  - decrease IVF to 75cc/h  - IV ABx (Zosyn)  - pain control prn; antiemetics prn  - Serial abdominal examinations  - He does not appear peritonitic or septic at this time. Will attempt conservative management of perforated appendicitis at this time. Patient and his wife are agreeable and understand if he clinically deteriorates he may require more emergent surgical intervention this admission.     - medical management of co-morbidities    All of the above findings and recommendations were discussed with the patient and his family, and all of their questions were answered to their expressed satisfaction.    Duanne GuessJennifer Rogerio Boutelle  9:01 AM

## 2018-08-22 NOTE — Progress Notes (Signed)
Patient ID: Joseph Hodges, male   DOB: [birth year], 63 y.o.   MRN: [id number]  IR Round note via phone  63 year old with perforated appendicitis and a pelvic abscess  IMPRESSION: 08/22/18: CT-guided placement of a transgluteal pelvic abscess drain.  Drain without OP per RN this am Flushing easily Site is clean and dry  NT  Pt not complaining of pain  Surgery has seen pt Will advance diet gradually Plan for another day or so in hospital  Will need to flush drain as OP daily 5-10 cc daily sterile saline Please give pt instructions for flushing Will need Rx for flushes He needs to record OP daily IR OP Clinic will call pt with follow up appt date and time Do not submerge drain site-- keep clean and dry

## 2018-08-22 NOTE — Progress Notes (Signed)
RN notified zack PA pt is complaining of nausea. Per PA okay for RN to place order for iv Zofran PRN Q6.

## 2018-08-23 ENCOUNTER — Inpatient Hospital Stay: Payer: No Typology Code available for payment source

## 2018-08-23 MED ORDER — PANTOPRAZOLE SODIUM 40 MG PO TBEC
40.0000 mg | DELAYED_RELEASE_TABLET | Freq: Every day | ORAL | Status: DC
  Administered 2018-08-24 – 2018-08-27 (×4): 40 mg via ORAL
  Filled 2018-08-23 (×4): qty 1

## 2018-08-23 MED ORDER — ENSURE ENLIVE PO LIQD
237.0000 mL | Freq: Two times a day (BID) | ORAL | Status: DC
  Administered 2018-08-24 – 2018-08-27 (×2): 237 mL via ORAL

## 2018-08-23 NOTE — Progress Notes (Signed)
Pt's wife at desk asking to speak with MD. Asked by this RN if she had a questions that nursing staff could answer. Wife stated that she wanted to speak with the doctor. Messaged the doctor via epic chat and MD stated that she had recently left the pt's room and would attempt to call room.

## 2018-08-23 NOTE — Progress Notes (Addendum)
Joseph Hodges, Joseph Hodges, Joseph Hodges, Joseph Hodges. He did report flatus and watery bowel movements yesterday but this has subside. He tolerated full liquids without issue. Is endorsing more burping today. Plans to mobilize more this afternoon. JP is now serous.   Review of Systems:  Constitutional: denies Hodges, Joseph Hodges  HEENT: denies cough Joseph congestion  Respiratory: denies any shortness of breath  Cardiovascular: denies chest pain Joseph palpitations  Gastrointestinal: + abdominal pain, distension (mild), nausea, denied emesis Genitourinary: denies burning with urination Joseph urinary frequency  Vital signs in last 24 hours: [min-max] current  Temp:  [97.9 F (36.6 C)-98.1 F (36.7 C)] 97.9 F (36.6 C) (08/06 0433) Pulse Rate:  [64-87] 64 (08/06 0433) Resp:  [16-20] 18 (08/06 0433) BP: (105-130)/(76-92) 130/92 (08/06 0433) SpO2:  [94 %-98 %] 94 % (08/06 0433)     Height: 6\' 1"  (185.4 cm) Weight: 95.3 kg BMI (Calculated): 27.71   Intake/Output last 2 shifts:  08/05 0701 - 08/06 0700 In: 1885.4 [I.V.:1823.8; IV Piggyback:51.6] Out: 522 [Urine:500; Drains:22]   Physical Exam:  Constitutional: alert, cooperative and Joseph distress  HENT: normocephalic without obvious abnormality  Eyes: PERRL, EOM's grossly intact and symmetric  Respiratory: breathing non-labored at rest  Cardiovascular: regular rate and sinus rhythm  Gastrointestinal: soft, non-tender, and mild distension, tympanic to percussion, JP in LLQ with serous output   Labs:  CBC Latest Ref Rng & Units 08/22/2018 08/21/2018  WBC 4.0 - 10.5 K/uL 5.0 9.3  Hemoglobin  13.0 - 17.0 g/dL 19.115.3 17.8(H)  Hematocrit 39.0 - 52.0 % 44.4 50.9  Platelets 150 - 400 K/uL 181 221   CMP Latest Ref Rng & Units 08/22/2018 08/21/2018  Glucose 70 - 99 mg/dL 478(G128(H) 956(O180(H)  BUN 8 - 23 mg/dL 13(Y40(H) 86(V34(H)  Creatinine 0.61 - 1.24 mg/dL 7.841.18 6.961.05  Sodium 295135 - 145 mmol/L 133(L) 131(L)  Potassium 3.5 - 5.1 mmol/L 4.0 3.6  Chloride 98 - 111 mmol/L 96(L) 91(L)  CO2 22 - 32 mmol/L 25 26  Calcium 8.9 - 10.3 mg/dL 8.2(L) 9.3  Total Protein 6.5 - 8.1 g/dL - 8.5(H)  Total Bilirubin 0.3 - 1.2 mg/dL - 1.5(H)  Alkaline Phos 38 - 126 U/L - 54  AST 15 - 41 U/L - 22  ALT 0 - 44 U/L - 26     Imaging studies:   KUB (08/23/2018) personally reviewed with bowel distension most likely representative of ileus secondary to infection, and radiologist report reviewed:  IMPRESSION: Diffuse small bowel dilatation and paucity of colonic gas which could be due to a small bowel obstruction Joseph ileus. Bowel wall thickening of a single small bowel loop in the lateral RIGHT pelvis.   Assessment/Plan: (ICD-10's: K35.32, 38K56.7) 63 y.o. male with improving abdominal pain however still with abdominal distension and likely ileus secondary to perforated appendicitis with abscess s/p drain placement with IR.   - Continue of full liquids; once more consistent bowel function/improving distension can advance diet   - Continue IV ABx (Zosyn)  - pain control prn (minimize narcotics); antiemetics prn  - monitor abdominal examination; on-going bowel function    - Joseph indication for emergent surgical intervention; he  understands if he clinically deteriorates he may require more emergent intervention this admission   - Benefit from interval appendectomy outpatient    - medical management of comorbidities  - mobilization encouraged  All of the above findings and recommendations were discussed with the patient, and the medical team, and all of patient's questions were answered to his expressed  satisfaction.  -- Joseph Simon, PA-C [facility] 08/23/2018, 11:23 AM [phone] M-F: 7am - 4pm   I saw and evaluated the patient.  On my exam, his belly does seem more distended than yesterday.  Review of the radiographs suggest ileus.  I also spoke with the patient's wife, and relayed the plan of care to her via telephone.  I agree with the above documentation, exam, and plan, which I have edited where appropriate. Joseph Hodges  1:46 PM

## 2018-08-23 NOTE — Progress Notes (Signed)
Pt's wife asking about test results. Notified MD who requested to have PA speak with pt/family. Notified PA who stated that he would go see pt/fam.

## 2018-08-24 ENCOUNTER — Inpatient Hospital Stay: Payer: No Typology Code available for payment source

## 2018-08-24 LAB — BASIC METABOLIC PANEL
Anion gap: 10 (ref 5–15)
BUN: 24 mg/dL — ABNORMAL HIGH (ref 8–23)
CO2: 29 mmol/L (ref 22–32)
Calcium: 8.3 mg/dL — ABNORMAL LOW (ref 8.9–10.3)
Chloride: 98 mmol/L (ref 98–111)
Creatinine, Ser: 0.98 mg/dL (ref 0.61–1.24)
GFR calc Af Amer: >60 mL/min (ref >60)
GFR calc non Af Amer: >60 mL/min (ref >60)
Glucose, Bld: 123 mg/dL — ABNORMAL HIGH (ref 70–99)
Potassium: 3.8 mmol/L (ref 3.5–5.1)
Sodium: 137 mmol/L (ref 135–145)

## 2018-08-24 LAB — C DIFFICILE QUICK SCREEN W PCR REFLEX
C Diff antigen: NEGATIVE
C Diff interpretation: NOT DETECTED
C Diff toxin: NEGATIVE

## 2018-08-24 NOTE — Progress Notes (Addendum)
[facility] SURGICAL PROGRESS NOTE (cpt [phone])  Hospital Day(s): 3.   Post op day(s):  Marland Kitchen.   Interval History: Patient seen and examined, no acute events or new complaints overnight. Patient reports that "he had a good night." He is still reporting abdominal distension but no significant pain. No nausea or emesis yesterday afternoon. He continues to report loose stools, mostly after walking. No fever or chills. His JP output  is still serous. No other acute issues.   Review of Systems:  Constitutional: denies fever, chills  HEENT: denies cough or congestion  Respiratory: denies any shortness of breath  Cardiovascular: denies chest pain or palpitations  Gastrointestinal: + distension, + diarrhea, denies nausea, emesis Genitourinary: denies burning with urination or urinary frequency   Vital signs in last 24 hours: [min-max] current  Temp:  [97.7 F (36.5 C)-98.6 F (37 C)] 97.7 F (36.5 C) (08/07 0531) Pulse Rate:  [53-56] 56 (08/07 0531) Resp:  [18] 18 (08/07 0531) BP: (114-134)/(74-76) 134/76 (08/07 0531) SpO2:  [93 %-97 %] 94 % (08/07 0531)     Height: 6\' 1"  (185.4 cm) Weight: 95.3 kg BMI (Calculated): 27.71   Intake/Output last 2 shifts:  08/06 0701 - 08/07 0700 In: 1748.9 [I.V.:1693.9; IV Piggyback:50] Out: 10 [Drains:10]   Physical Exam:  Constitutional: alert, cooperative and no distress  HENT: normocephalic without obvious abnormality  Eyes: PERRL, EOM's grossly intact and symmetric  Respiratory: breathing non-labored at rest  Cardiovascular: regular rate and sinus rhythm  Gastrointestinal: soft, non-tender, and mild distension, tympanic to percussion, JP in LLQ with serous output   Labs:  CBC Latest Ref Rng & Units 08/22/2018 08/21/2018  WBC 4.0 - 10.5 K/uL 5.0 9.3  Hemoglobin 13.0 - 17.0 g/dL 60.415.3 17.8(H)  Hematocrit 39.0 - 52.0 % 44.4 50.9  Platelets 150 - 400 K/uL 181 221   CMP Latest Ref Rng & Units 08/22/2018 08/21/2018  Glucose 70 - 99 mg/dL  540(J128(H) 811(B180(H)  BUN 8 - 23 mg/dL 14(N40(H) 82(N34(H)  Creatinine 0.61 - 1.24 mg/dL 5.621.18 1.301.05  Sodium 865135 - 145 mmol/L 133(L) 131(L)  Potassium 3.5 - 5.1 mmol/L 4.0 3.6  Chloride 98 - 111 mmol/L 96(L) 91(L)  CO2 22 - 32 mmol/L 25 26  Calcium 8.9 - 10.3 mg/dL 8.2(L) 9.3  Total Protein 6.5 - 8.1 g/dL - 8.5(H)  Total Bilirubin 0.3 - 1.2 mg/dL - 1.5(H)  Alkaline Phos 38 - 126 U/L - 54  AST 15 - 41 U/L - 22  ALT 0 - 44 U/L - 26     Imaging studies:   KUB pending this morning   Assessment/Plan: (ICD-10's: K35.32, K56.7) 63 y.o. male still with abdominal distension and likely ileus secondary to perforated appendicitis with abscess s/p drain placement with IR   - Continue of full liquids; once more consistent bowel function/improving distension can advance diet              - Continue IV ABx (Zosyn)  - Will check c diff given diarrhea  - repeat KUB this morning             - pain control prn (minimize narcotics); antiemetics prn             - monitor abdominal examination; on-going bowel function               - no indication for emergent surgical intervention; he understands if he clinically deteriorates he may require more emergent intervention this admission              -  Benefit from interval appendectomy outpatient               - medical management of comorbidities             - mobilization encouraged  All of the above findings and recommendations were discussed with the patient, and the medical team, and all of patient's questions were answered to his expressed satisfaction.  -- Edison Simon, PA-C [facility] 08/24/2018, 7:55 AM [phone] M-F: 7am - 4pm  I saw and evaluated the patient.  I agree with the above documentation, exam, and plan, which I have edited where appropriate. Fredirick Maudlin  10:17 AM

## 2018-08-25 MED ORDER — ALUM & MAG HYDROXIDE-SIMETH 200-200-20 MG/5ML PO SUSP
30.0000 mL | ORAL | Status: DC | PRN
  Administered 2018-08-25: 30 mL via ORAL
  Filled 2018-08-25: qty 30

## 2018-08-25 NOTE — Progress Notes (Addendum)
[facility] SURGICAL PROGRESS NOTE (cpt [phone])  Hospital Day(s): 4.   Post op day(s):  Marland Kitchen   Interval History: Patient seen and examined, no acute events or new complaints overnight.  He continues to ambulate regularly and feels like his abdomen is less distended.  Tolerating a soft diet, but with limited appetite.  No fever or chills. His JP output  is still serous. No other acute issues.   Review of Systems:  Constitutional: denies fever, chills  HEENT: denies cough or congestion  Respiratory: denies any shortness of breath  Cardiovascular: denies chest pain or palpitations  Gastrointestinal: + distension, + diarrhea, denies nausea, emesis Genitourinary: denies burning with urination or urinary frequency   Vital signs in last 24 hours: [min-max] current  Temp:  [98.4 F (36.9 C)-99.2 F (37.3 C)] 99.2 F (37.3 C) (08/08 0435) Pulse Rate:  [58-59] 58 (08/08 0435) Resp:  [18-20] 18 (08/08 0435) BP: (140-143)/(89-90) 140/89 (08/08 0435) SpO2:  [96 %-98 %] 98 % (08/08 0435)     Height: 6\' 1"  (185.4 cm) Weight: 95.3 kg BMI (Calculated): 27.71   Intake/Output last 2 shifts:  08/07 0701 - 08/08 0700 In: 255 [P.O.:240] Out: 470 [Urine:450; Drains:20]   Physical Exam:  Constitutional: alert, cooperative and no distress  HENT: normocephalic without obvious abnormality  Eyes: PERRL, EOM's grossly intact and symmetric  Respiratory: breathing non-labored at rest  Cardiovascular: regular rate and sinus rhythm  Gastrointestinal: soft, non-tender, and mild distension, JP with serous output   Labs:  CBC Latest Ref Rng & Units 08/22/2018 08/21/2018  WBC 4.0 - 10.5 K/uL 5.0 9.3  Hemoglobin 13.0 - 17.0 g/dL 15.3 17.8(H)  Hematocrit 39.0 - 52.0 % 44.4 50.9  Platelets 150 - 400 K/uL 181 221   CMP Latest Ref Rng & Units 08/24/2018 08/22/2018 08/21/2018  Glucose 70 - 99 mg/dL 123(H) 128(H) 180(H)  BUN 8 - 23 mg/dL 24(H) 40(H) 34(H)  Creatinine 0.61 - 1.24 mg/dL 0.98 1.18 1.05   Sodium 135 - 145 mmol/L 137 133(L) 131(L)  Potassium 3.5 - 5.1 mmol/L 3.8 4.0 3.6  Chloride 98 - 111 mmol/L 98 96(L) 91(L)  CO2 22 - 32 mmol/L 29 25 26   Calcium 8.9 - 10.3 mg/dL 8.3(L) 8.2(L) 9.3  Total Protein 6.5 - 8.1 g/dL - - 8.5(H)  Total Bilirubin 0.3 - 1.2 mg/dL - - 1.5(H)  Alkaline Phos 38 - 126 U/L - - 54  AST 15 - 41 U/L - - 22  ALT 0 - 44 U/L - - 26     Assessment/Plan: (ICD-10's: K35.32, K56.7) 63 y.o. male with improved abdominal distension and resolving ileus secondary to perforated appendicitis with abscess s/p drain placement with IR   - Change to PO abx  - C. difficile was negative             - pain control prn (minimize narcotics); antiemetics prn             - monitor abdominal examination; on-going bowel function               - no indication for emergent surgical intervention; he understands if he clinically deteriorates he may require more emergent intervention this admission              - We will plan for interval appendectomy as an outpatient               - medical management of comorbidities             -  mobilization encouraged  - drain teaching in anticipation of d/c tomorrow.  All of the above findings and recommendations were discussed with the patient, and the medical team, and all of patient's questions were answered to his expressed satisfaction.

## 2018-08-26 LAB — AEROBIC/ANAEROBIC CULTURE W GRAM STAIN (SURGICAL/DEEP WOUND)

## 2018-08-26 MED ORDER — AMOXICILLIN-POT CLAVULANATE 875-125 MG PO TABS
1.0000 | ORAL_TABLET | Freq: Two times a day (BID) | ORAL | Status: DC
  Administered 2018-08-26 – 2018-08-27 (×3): 1 via ORAL
  Filled 2018-08-26 (×3): qty 1

## 2018-08-27 MED ORDER — AMOXICILLIN-POT CLAVULANATE 875-125 MG PO TABS: 1.0000 | ORAL_TABLET | Freq: Two times a day (BID) | ORAL | 0 refills | Status: AC

## 2018-08-27 MED ORDER — CIPROFLOXACIN HCL 500 MG PO TABS: 500.0000 mg | ORAL_TABLET | Freq: Two times a day (BID) | ORAL | 0 refills | Status: AC

## 2018-08-27 NOTE — Progress Notes (Signed)
[facility]  August 27, 2018   Patient: Joseph Hodges  Date of Birth: 02-Apr-1955  Date of Visit: 08/21/2018    To Whom It May Concern:  [name] was [facility] to [facility]/04-[facility]10;   It is my medical opinion that [name] may return to work on 08/14.  If you have any questions or concerns, please don't hesitate to call.  Sincerely,  Tylene Fantasia, PA-C

## 2018-08-27 NOTE — Progress Notes (Signed)
Joseph Hodges  A and O x 4. VSS. Pt tolerating diet well. No complaints of pain or nausea. IV removed intact, prescriptions given. RN provided teaching to pt of how to manage is drain and provided with supplies to flush it.  Pt voiced understanding of discharge instructions with no further questions. Pt discharged home.    Allergies as of 08/27/2018   No Known Allergies     Medication List    TAKE these medications   acetaminophen 325 MG tablet Commonly known as: TYLENOL Take 650 mg by mouth every 6 (six) hours as needed.   amoxicillin-clavulanate 875-125 MG tablet Commonly known as: AUGMENTIN Take 1 tablet by mouth every 12 (twelve) hours for 5 days.   B-Complex-C Tabs Take 1 tablet by mouth daily.   calcium carbonate 500 MG chewable tablet Commonly known as: TUMS - dosed in mg elemental calcium Chew 1 tablet by mouth daily.   ciprofloxacin 500 MG tablet Commonly known as: Cipro Take 1 tablet (500 mg total) by mouth 2 (two) times daily for 5 days.   ibuprofen 200 MG tablet Commonly known as: ADVIL Take 200 mg by mouth every 6 (six) hours as needed.       Vitals:   08/26/18 2141 08/27/18 0448  BP: (!) 150/97 (!) 140/97  Pulse: 67 (!) 52  Resp: 16 18  Temp: 98.5 F (36.9 C) 97.6 F (36.4 C)  SpO2: 97% 95%    Francesco Sor

## 2018-08-27 NOTE — Discharge Summary (Addendum)
[facility] SURGICAL DISCHARGE SUMMARY  Patient ID: Joseph QuailDarren Hodges MRN: [id number] DOB/AGE: October 12, 1955 63 y.o.  Admit date: 08/21/2018 Discharge date: 08/27/2018  Discharge Diagnoses Patient Active Problem List   Diagnosis Date Noted  . Acute perforated appendicitis 08/21/2018    Consultants Interventional Radiology  Procedures 08/21/2018 Percutaneous drainage of abscess  HPI: 63 y.o. male presented to [facility] ED overnight (08/04) for abdominal pain. Patient reports about a 3 day history of diffuse pain across his lower abdomen. He described this as being crampy in nature and rated it at the worst a 9 out of 10. Nothing seems to make the pain better. He endorses associate nausea, emesis, and decreased appetite with the pain. No fever, chills, chest pain, SOB, cough, congestion, or bladder/bowel changes. No history of similar pain in the past. No previous abdominal surgeries. Work up in the ED was concerning for perforated appendicitis with abscess and likely ileus.   Hospital Course: Patient was admitted to the general surgery service. Interventional radiology was consulted on day of admission and he underwent percutaneous drainage of intra-abdominal abscess. He initially did well however he developed an ileus which resolved after a few days. Advancement of patient's diet and ambulation were well-tolerated. The remainder of patient's hospital course was essentially unremarkable, and discharge planning was initiated accordingly with patient safely able to be discharged home with appropriate discharge instructions, antibiotics (Augmentin + Ciprofloxacin x5 days to ensure he completes ~10 days of ABx total), pain control, and outpatient follow-up after all of his and his family's questions were answered to their expressed satisfaction.  Discharge Condition: Good  Physical Examination:  Constitutional: Well appearing male, NAD Pulmonary: Normal effort, no respiratory  distress Gastrointestinal: Soft, non-tender, non-distended. No rebound/guarding. JP in LLQ with serosanguinous fluid Skin: Warm, dry, ecchymosis in RLQ   Allergies as of 08/27/2018   No Known Allergies     Medication List    TAKE these medications   acetaminophen 325 MG tablet Commonly known as: TYLENOL Take 650 mg by mouth every 6 (six) hours as needed.   amoxicillin-clavulanate 875-125 MG tablet Commonly known as: AUGMENTIN Take 1 tablet by mouth every 12 (twelve) hours for 5 days.   B-Complex-C Tabs Take 1 tablet by mouth daily.   calcium carbonate 500 MG chewable tablet Commonly known as: TUMS - dosed in mg elemental calcium Chew 1 tablet by mouth daily.   ciprofloxacin 500 MG tablet Commonly known as: Cipro Take 1 tablet (500 mg total) by mouth 2 (two) times daily for 5 days.   ibuprofen 200 MG tablet Commonly known as: ADVIL Take 200 mg by mouth every 6 (six) hours as needed.        Follow-up Information    Richarda OverlieHenn, Adam, MD Follow up in 8 day(s).   Specialties: Interventional Radiology, Radiology Why: pt will hear from IR OP Clinic for time and date for follow up with Dr Lowella DandyHenn--- call [phone] if any questions/concerns Contact information: [address] [place] [address] [phone]        Duanne Guessannon, Mahmud Keithly, MD. Schedule an appointment as soon as possible for a visit on 08/30/2018.   Specialty: General Surgery Contact information: [address] [place] [address] [phone]            Time spent on discharge management including discussion of hospital course, clinical condition, outpatient instructions, prescriptions, and follow up with the patient and members of the medical team: >30 minutes  -- Lynden OxfordZachary Schulz , PA-C [facility]  08/27/2018, 9:13 AM [phone] M-F: 7am - 4pm  I saw and evaluated the patient.  I agree with the above documentation, exam, and plan, which I have edited  where appropriate. Fredirick Maudlin  12:05 PM

## 2018-08-28 ENCOUNTER — Other Ambulatory Visit: Payer: Self-pay | Admitting: General Surgery

## 2018-08-28 DIAGNOSIS — K3532 Acute appendicitis with perforation and localized peritonitis, without abscess: Secondary | ICD-10-CM

## 2018-08-30 ENCOUNTER — Ambulatory Visit (INDEPENDENT_AMBULATORY_CARE_PROVIDER_SITE_OTHER): Payer: No Typology Code available for payment source | Admitting: General Surgery

## 2018-08-30 ENCOUNTER — Encounter: Payer: Self-pay | Admitting: General Surgery

## 2018-08-30 ENCOUNTER — Other Ambulatory Visit: Payer: Self-pay

## 2018-08-30 VITALS — BP 172/108 | HR 80 | Temp 96.6°F | Ht 73.0 in | Wt 211.2 lb

## 2018-08-30 DIAGNOSIS — K3532 Acute appendicitis with perforation and localized peritonitis, without abscess: Secondary | ICD-10-CM

## 2018-08-30 NOTE — Progress Notes (Signed)
Patient ID: Joseph Hodges, male   DOB: March 28, 1955, 63 y.o.   MRN: [id number]  Chief Complaint  Patient presents with  . Follow-up    hospital follow up new to office Acute perforated appendicitis     HPI Joseph Hodges is a 63 y.o. male.   He is a patient whom I saw in the hospital last week.  He presented to the emergency department on August 5 with a 3-day history of right lower quadrant abdominal pain.  Imaging revealed perforated appendicitis with an abscess.  A drain was placed by interventional radiology.  He also had an ileus, secondary to the peritonitis from his perforated appendix.  He remained in the hospital while his pain was controlled, his ileus resolved, and his diet advanced.  He was sent home with a drain in place, on oral antibiotics.  He is scheduled for a CT scan and follow-up in the [facility] radiology drain clinic next week.  He has had very little out of his drain.  He denies any fevers or chills.  His abdominal pain has improved, although he is still having some loose stools.  He says that he was quite physically active on Tuesday, then felt a little bit run down yesterday.  He felt better today and went for a good long walk.   Past Medical History:  Diagnosis Date  . Patient denies medical problems     Past Surgical History:  Procedure Laterality Date  . FINGER SURGERY Right 2010    No family history on file.  Social History Social History   Tobacco Use  . Smoking status: Never Smoker  . Smokeless tobacco: Never Used  Substance Use Topics  . Alcohol use: Not on file  . Drug use: Not on file    No Known Allergies  Current Outpatient Medications  Medication Sig Dispense Refill  . acetaminophen (TYLENOL) 325 MG tablet Take 650 mg by mouth every 6 (six) hours as needed.    Marland Kitchen amoxicillin-clavulanate (AUGMENTIN) 875-125 MG tablet Take 1 tablet by mouth every 12 (twelve) hours for 5 days. 10 tablet 0  . B-Complex-C TABS Take 1 tablet by mouth daily.    .  calcium carbonate (TUMS - DOSED IN MG ELEMENTAL CALCIUM) 500 MG chewable tablet Chew 1 tablet by mouth daily.    . ciprofloxacin (CIPRO) 500 MG tablet Take 1 tablet (500 mg total) by mouth 2 (two) times daily for 5 days. 10 tablet 0  . ibuprofen (ADVIL) 200 MG tablet Take 200 mg by mouth every 6 (six) hours as needed.     No current facility-administered medications for this visit.      Blood pressure (!) 172/108, pulse 80, temperature (!) 96.6 F (35.9 C), temperature source Temporal, height 6\' 1"  (1.854 m), weight 211 lb 3.2 oz (95.8 kg), SpO2 96 %.  Physical Exam Physical Exam Vitals signs reviewed.  Constitutional:      General: He is not in acute distress.    Appearance: Normal appearance.  HENT:     Head: Normocephalic and atraumatic.     Nose:     Comments: Covered with a mask secondary to COVID-19 precautions    Mouth/Throat:     Comments: Covered with a mask secondary to COVID-19 precautions Eyes:     General: No scleral icterus.    Conjunctiva/sclera: Conjunctivae normal.  Neck:     Musculoskeletal: Normal range of motion.  Cardiovascular:     Rate and Rhythm: Normal rate and regular rhythm.  Pulmonary:  Effort: Pulmonary effort is normal.  Abdominal:     Palpations: Abdomen is soft.     Comments: There is bruising present on his lower abdomen, secondary to Lovenox injections while in the hospital.  There is minimal right lower quadrant tenderness to palpation.  No peritoneal signs.  His transgluteal drain has minimal serous fluid present.  Genitourinary:    Comments: Deferred Musculoskeletal:        General: No deformity.  Skin:    General: Skin is warm and dry.  Neurological:     General: No focal deficit present.     Mental Status: He is alert.  Psychiatric:        Mood and Affect: Mood normal.        Behavior: Behavior normal.     Data Reviewed His hospital course from his recent admission was reviewed, as discussed under the history of present  illness.  Assessment This is a 63 year old man who was treated in the hospital last week for acute perforated appendicitis.  He is improved since discharge.  Plan He should complete his current course of antibiotics.  He should keep his scheduled appointment with radiology next week.  We will have him return to our clinic in about 6 weeks' time to discuss interval appendectomy.    Duanne GuessJennifer Shanequia Kendrick 08/30/2018, 12:19 PM

## 2018-08-30 NOTE — Patient Instructions (Addendum)
The patient is aware to call back for any questions or new concerns.  Keep CT appointment  Continue drain care Resume activities as tolerated.

## 2018-09-05 ENCOUNTER — Other Ambulatory Visit: Payer: Self-pay | Admitting: Student

## 2018-09-05 ENCOUNTER — Encounter: Payer: Self-pay | Admitting: Radiology

## 2018-09-05 ENCOUNTER — Ambulatory Visit
Admission: RE | Admit: 2018-09-05 | Discharge: 2018-09-05 | Disposition: A | Discharge status: Home / Self Care | Payer: No Typology Code available for payment source | Source: Ambulatory Visit | Attending: General Surgery | Admitting: General Surgery

## 2018-09-05 ENCOUNTER — Other Ambulatory Visit (HOSPITAL_COMMUNITY): Payer: Self-pay | Admitting: Interventional Radiology

## 2018-09-05 ENCOUNTER — Ambulatory Visit
Admission: RE | Admit: 2018-09-05 | Discharge: 2018-09-05 | Disposition: A | Discharge status: Home / Self Care | Payer: No Typology Code available for payment source | Source: Ambulatory Visit | Attending: Physician Assistant | Admitting: Physician Assistant

## 2018-09-05 DIAGNOSIS — K3532 Acute appendicitis with perforation and localized peritonitis, without abscess: Secondary | ICD-10-CM

## 2018-09-05 HISTORY — PX: IR RADIOLOGIST EVAL & MGMT: IMG5224

## 2018-09-05 MED ORDER — IOPAMIDOL (ISOVUE-300) INJECTION 61%
100.0000 mL | Freq: Once | INTRAVENOUS | Status: AC | PRN
  Administered 2018-09-05: 100 mL via INTRAVENOUS

## 2018-09-05 NOTE — Progress Notes (Addendum)
Referring Physician(s): Dr. Celine Ahr  Chief Complaint: The patient is seen in follow up today s/p perforated appendicitis  History of present illness: Joseph Hodges is a 63 year old male with no significant past medical history he was evaluated at [facility] ED for acute abdominal pain 08/21/2018.  He was found to have perforated appendicitis with an abscess.  He underwent drain placement 08/21/2018 by Dr. Anselm Pancoast.  He subsequently improved and was discharged home with drain in place and on antibiotics.  He returns to IR clinic today for drain evaluation.  He was recently seen by his surgeon, Dr. Celine Ahr, on 08/30/18.  Plan is for eventual appendectomy.  Patient presents to clinic today in his usual state of health.  He completed his regimen of antibiotics on Saturday. He has been eating soft, bland foods.  He has been trying to increase his activity.  He reports intermittent abdominal pain and diarrhea with last episode of diarrhea this morning.  He has been flushing his drainage catheter with 5 mL daily.  Reports minimal output.  Past Medical History:  Diagnosis Date  . Patient denies medical problems     Past Surgical History:  Procedure Laterality Date  . FINGER SURGERY Right 2010    Allergies: Patient has no known allergies.  Medications: Prior to Admission medications   Medication Sig Start Date End Date Taking? Authorizing Provider  acetaminophen (TYLENOL) 325 MG tablet Take 650 mg by mouth every 6 (six) hours as needed.    [provider]  B-Complex-C TABS Take 1 tablet by mouth daily.    [provider]  calcium carbonate (TUMS - DOSED IN MG ELEMENTAL CALCIUM) 500 MG chewable tablet Chew 1 tablet by mouth daily.    [provider]  ibuprofen (ADVIL) 200 MG tablet Take 200 mg by mouth every 6 (six) hours as needed.    [provider]     No family history on file.  Social History   Socioeconomic History  . Marital status: Married    Spouse  name: Not on file  . Number of children: Not on file  . Years of education: Not on file  . Highest education level: Not on file  Occupational History  . Not on file  Social Needs  . Financial resource strain: Not on file  . Food insecurity    Worry: Not on file    Inability: Not on file  . Transportation needs    Medical: Not on file    Non-medical: Not on file  Tobacco Use  . Smoking status: Never Smoker  . Smokeless tobacco: Never Used  Substance and Sexual Activity  . Alcohol use: Not on file  . Drug use: Not on file  . Sexual activity: Not on file  Lifestyle  . Physical activity    Days per week: Not on file    Minutes per session: Not on file  . Stress: Not on file  Relationships  . Social Herbalist on phone: Not on file    Gets together: Not on file    Attends religious service: Not on file    Active member of club or organization: Not on file    Attends meetings of clubs or organizations: Not on file    Relationship status: Not on file  Other Topics Concern  . Not on file  Social History Narrative  . Not on file     Vital Signs: There were no vitals taken for this visit.  Physical Exam  NAD, alert Abdomen: Soft, mild tenderness to the right lower quadrant.  Left sided transgluteal drain intact.  Insertion site clean and dry.  Minimal amount of serous fluid in bulb.  Imaging: No results found.  Labs:  CBC: Recent Labs    08/21/18 0446 08/22/18 0753  WBC 9.3 5.0  HGB 17.8* 15.3  HCT 50.9 44.4  PLT 221 181    COAGS: No results for input(s): INR, APTT in the last 8760 hours.  BMP: Recent Labs    08/21/18 0446 08/22/18 0753 08/24/18 0806  NA 131* 133* 137  K 3.6 4.0 3.8  CL 91* 96* 98  CO2 '26 25 29  ' GLUCOSE 180* 128* 123*  BUN 34* 40* 24*  CALCIUM 9.3 8.2* 8.3*  CREATININE 1.05 1.18 0.98  GFRNONAA >60 >60 >60  GFRAA >60 >60 >60    LIVER FUNCTION TESTS: Recent Labs    08/21/18 0446  BILITOT 1.5*  AST 22  ALT 26   ALKPHOS 54  PROT 8.5*  ALBUMIN 4.2    Assessment: Perforated appendicitis status post drain placement by Dr. Anselm Pancoast 08/21/2018  Joseph Hodges is a 63 year old male who presents to drain clinic today for follow-up of his transgluteal drain. He reports some improvement in his symptoms at home, however continues with some right lower quadrant tenderness and diarrhea. He has completed his antibiotic regimen. CT imaging performed today and reviewed by Dr. Annamaria Boots.  Pelvic collection appears resolved and given decreased output will proceed with transgluteal drain removal today.  However, Dr. Annamaria Boots also notes a new collection, possible abscess, in the right lower quadrant.  Patient will be scheduled for a new outpatient drain placement as soon as possible.  Fluid will be cultured and decision made on proceeding with new antibiotic regimen at the time of drain placement. Drain removed by PA in its entirety without complication.  Dressing placed.  Dr. Annamaria Boots has met with patient and discussed CT findings as well as need for new drain placement.  Patient verbalizes understanding.  He understands schedulers will contact him with date and time of appointment for drain placement.  Signed: Docia Barrier, PA 09/05/2018, 1:14 PM   Please refer to Dr. Annamaria Boots attestation of this note for management and plan.

## 2018-09-06 ENCOUNTER — Ambulatory Visit
Admission: RE | Admit: 2018-09-06 | Discharge: 2018-09-06 | Disposition: A | Discharge status: Home / Self Care | Payer: No Typology Code available for payment source | Source: Ambulatory Visit | Attending: Interventional Radiology | Admitting: Interventional Radiology

## 2018-09-06 ENCOUNTER — Other Ambulatory Visit: Payer: Self-pay

## 2018-09-06 ENCOUNTER — Telehealth: Payer: Self-pay | Admitting: *Deleted

## 2018-09-06 ENCOUNTER — Other Ambulatory Visit: Payer: Self-pay | Admitting: Student

## 2018-09-06 DIAGNOSIS — K3532 Acute appendicitis with perforation and localized peritonitis, without abscess: Secondary | ICD-10-CM | POA: Diagnosis present

## 2018-09-06 DIAGNOSIS — K3533 Acute appendicitis with perforation and localized peritonitis, with abscess: Secondary | ICD-10-CM | POA: Diagnosis not present

## 2018-09-06 LAB — CBC WITH DIFFERENTIAL/PLATELET
Abs Immature Granulocytes: 0.05 10*3/uL (ref 0.00–0.07)
Basophils Absolute: 0.1 10*3/uL (ref 0.0–0.1)
Basophils Relative: 0 %
Eosinophils Absolute: 0 10*3/uL (ref 0.0–0.5)
Eosinophils Relative: 0 %
HCT: 42 % (ref 39.0–52.0)
Hemoglobin: 14.2 g/dL (ref 13.0–17.0)
Immature Granulocytes: 0 %
Lymphocytes Relative: 12 %
Lymphs Abs: 1.6 10*3/uL (ref 0.7–4.0)
MCH: 32.2 pg (ref 26.0–34.0)
MCHC: 33.8 g/dL (ref 30.0–36.0)
MCV: 95.2 fL (ref 80.0–100.0)
Monocytes Absolute: 1.2 10*3/uL — ABNORMAL HIGH (ref 0.1–1.0)
Monocytes Relative: 10 %
Neutro Abs: 9.8 10*3/uL — ABNORMAL HIGH (ref 1.7–7.7)
Neutrophils Relative %: 78 %
Platelets: 286 10*3/uL (ref 150–400)
RBC: 4.41 MIL/uL (ref 4.22–5.81)
RDW: 12.2 % (ref 11.5–15.5)
WBC: 12.7 10*3/uL — ABNORMAL HIGH (ref 4.0–10.5)
nRBC: 0 % (ref 0.0–0.2)

## 2018-09-06 LAB — APTT: aPTT: 35 seconds (ref 24–36)

## 2018-09-06 LAB — PROTIME-INR
INR: 1.3 — ABNORMAL HIGH (ref 0.8–1.2)
Prothrombin Time: 15.7 seconds — ABNORMAL HIGH (ref 11.4–15.2)

## 2018-09-06 MED ORDER — CEFAZOLIN SODIUM-DEXTROSE 1-4 GM/50ML-% IV SOLN
INTRAVENOUS | Status: AC | PRN
  Administered 2018-09-06: 2 g via INTRAVENOUS

## 2018-09-06 MED ORDER — CEFAZOLIN SODIUM-DEXTROSE 2-4 GM/100ML-% IV SOLN: 2.0000 g | INTRAVENOUS | Status: DC

## 2018-09-06 MED ORDER — FENTANYL CITRATE (PF) 100 MCG/2ML IJ SOLN
INTRAMUSCULAR | Status: AC | PRN
  Administered 2018-09-06: 25 ug via INTRAVENOUS
  Administered 2018-09-06: 50 ug via INTRAVENOUS

## 2018-09-06 MED ORDER — FENTANYL CITRATE (PF) 100 MCG/2ML IJ SOLN
INTRAMUSCULAR | Status: AC
  Filled 2018-09-06: qty 2

## 2018-09-06 MED ORDER — BUPIVACAINE HCL (PF) 0.25 % IJ SOLN
INTRAMUSCULAR | Status: AC
  Filled 2018-09-06: qty 30

## 2018-09-06 MED ORDER — SODIUM CHLORIDE 0.9 % IV SOLN
INTRAVENOUS | Status: DC
  Administered 2018-09-06: 11:00:00 via INTRAVENOUS

## 2018-09-06 MED ORDER — CEFAZOLIN SODIUM-DEXTROSE 2-4 GM/100ML-% IV SOLN
INTRAVENOUS | Status: AC
  Filled 2018-09-06: qty 100

## 2018-09-06 MED ORDER — MIDAZOLAM HCL 5 MG/5ML IJ SOLN
INTRAMUSCULAR | Status: AC
  Filled 2018-09-06: qty 5

## 2018-09-06 MED ORDER — MIDAZOLAM HCL 2 MG/2ML IJ SOLN
INTRAMUSCULAR | Status: AC | PRN
  Administered 2018-09-06: 1 mg via INTRAVENOUS

## 2018-09-06 NOTE — Progress Notes (Signed)
Patient clinically stable post abscess drain placement per Dr Lanell Persons well, eating lunch and taking po's without difficulty,.spoke with Julie/wife and patient over phone with discharge instructions given with questions answered.received Versed 1mg /Fentanyl29mcg iv for procedure. Instructions given for patient for care of drain at home.

## 2018-09-06 NOTE — Telephone Encounter (Signed)
Patient was contacted today by the request of Thedore Mins, [place] to notify him of his appointment with our office on 09-17-18 at 9:30 am.   The patient was requesting a letter to be out of work due to discomfort of drain.   Patient aware that we cannot provide a note as this is not medically indicated to be out of work for this.   The patient verbalizes understanding.

## 2018-09-06 NOTE — Progress Notes (Signed)
Spoke with Shon Millet, PA re: new antibiotic regimen in patient with new fluid collection after recent completed antibiotics.  Plan made to resume Cipro 500 mg BID and Augmentin 875mg  BID for 10 days.   If drain placed in IR today, patient will need follow-up in 1 week for CT and fluoro.  Surgery also planning to see patient in 10-14 days.   Ancef order for procedure today.   Brynda Greathouse, MS RD PA-C 9:52 AM

## 2018-09-06 NOTE — Discharge Instructions (Signed)
Percutaneous Abscess Drain, Care After This sheet gives you information about how to care for yourself after your procedure. Your health care provider may also give you more specific instructions. If you have problems or questions, contact your health care provider. What can I expect after the procedure? After your procedure, it is common to have:  A small amount of bruising and discomfort in the area where the drainage tube (catheter) was placed.  Sleepiness and fatigue. This should go away after the medicines you were given have worn off. Follow these instructions at home: Incision care  Follow instructions from your health care provider about how to take care of your incision. Make sure you: ? Wash your hands with soap and water before you change your bandage (dressing). If soap and water are not available, use hand sanitizer. ? Change your dressing as told by your health care provider. ? Leave stitches (sutures), skin glue, or adhesive strips in place. These skin closures may need to stay in place for 2 weeks or longer. If adhesive strip edges start to loosen and curl up, you may trim the loose edges. Do not remove adhesive strips completely unless your health care provider tells you to do that.  Check your incision area every day for signs of infection. Check for: ? More redness, swelling, or pain. ? More fluid or blood. ? Warmth. ? Pus or a bad smell. ? Fluid leaking from around your catheter (instead of fluid draining through your catheter). Catheter care   Follow instructions from your health care provider about emptying and cleaning your catheter and collection bag. You may need to clean the catheter every day so it does not clog.  If directed, write down the following information every time you empty your bag: ? The date and time. ? The amount of drainage. General instructions  Rest at home for 1-2 days after your procedure. Return to your normal activities as told by your  health care provider.  Do not take baths, swim, or use a hot tub for 24 hours after your procedure, or until your health care provider says that this is okay.  Take over-the-counter and prescription medicines only as told by your health care provider.  Keep all follow-up visits as told by your health care provider. This is important. Contact a health care provider if:  You have less than 10 mL of drainage a day for 2-3 days in a row, or as directed by your health care provider.  You have more redness, swelling, or pain around your incision area.  You have more fluid or blood coming from your incision area.  Your incision area feels warm to the touch.  You have pus or a bad smell coming from your incision area.  You have fluid leaking from around your catheter (instead of through your catheter).  You have a fever or chills.  You have pain that does not get better with medicine. Get help right away if:  Your catheter comes out.  You suddenly stop having drainage from your catheter.  You suddenly have blood in the fluid that is draining from your catheter.  You become dizzy or you faint.  You develop a rash.  You have nausea or vomiting.  You have difficulty breathing or you feel short of breath.  You develop chest pain.  You have problems with your speech or vision.  You have trouble balancing or moving your arms or legs. Summary  It is common to have a small  amount of bruising and discomfort in the area where the drainage tube (catheter) was placed.  You may be directed to record the amount of drainage from the bag every time you empty it.  Follow instructions from your health care provider about emptying and cleaning your catheter and collection bag. This information is not intended to replace advice given to you by your health care provider. Make sure you discuss any questions you have with your health care provider. Document Released: 05/20/2013 Document  Revised: 12/16/2016 Document Reviewed: 11/26/2015 Elsevier Patient Education  2020 [employer]. Moderate Conscious Sedation, Adult, Care After These instructions provide you with information about caring for yourself after your procedure. Your health care provider may also give you more specific instructions. Your treatment has been planned according to current medical practices, but problems sometimes occur. Call your health care provider if you have any problems or questions after your procedure. What can I expect after the procedure? After your procedure, it is common:  To feel sleepy for several hours.  To feel clumsy and have poor balance for several hours.  To have poor judgment for several hours.  To vomit if you eat too soon. Follow these instructions at home: For at least 24 hours after the procedure:   Do not: ? Participate in activities where you could fall or become injured. ? Drive. ? Use heavy machinery. ? Drink alcohol. ? Take sleeping pills or medicines that cause drowsiness. ? Make important decisions or sign legal documents. ? Take care of children on your own.  Rest. Eating and drinking  Follow the diet recommended by your health care provider.  If you vomit: ? Drink water, juice, or soup when you can drink without vomiting. ? Make sure you have little or no nausea before eating solid foods. General instructions  Have a responsible adult stay with you until you are awake and alert.  Take over-the-counter and prescription medicines only as told by your health care provider.  If you smoke, do not smoke without supervision.  Keep all follow-up visits as told by your health care provider. This is important. Contact a health care provider if:  You keep feeling nauseous or you keep vomiting.  You feel light-headed.  You develop a rash.  You have a fever. Get help right away if:  You have trouble breathing. This information is not intended to  replace advice given to you by your health care provider. Make sure you discuss any questions you have with your health care provider. Document Released: 10/24/2012 Document Revised: 12/16/2016 Document Reviewed: 04/25/2015 Elsevier Patient Education  2020 [employer].

## 2018-09-11 ENCOUNTER — Other Ambulatory Visit: Payer: Self-pay | Admitting: General Surgery

## 2018-09-11 ENCOUNTER — Telehealth: Payer: Self-pay | Admitting: *Deleted

## 2018-09-11 DIAGNOSIS — K3532 Acute appendicitis with perforation and localized peritonitis, without abscess: Secondary | ICD-10-CM

## 2018-09-11 LAB — AEROBIC/ANAEROBIC CULTURE W GRAM STAIN (SURGICAL/DEEP WOUND): Special Requests: NORMAL

## 2018-09-11 MED ORDER — SODIUM CHLORIDE 0.9% FLUSH: 10.0000 mL | Freq: Two times a day (BID) | INTRAVENOUS | 0 refills | Status: DC

## 2018-09-11 NOTE — Telephone Encounter (Signed)
Patient called to say he needed some more saline syringes to flush his drain called into [facility]. RX faxed to pharmacy

## 2018-09-17 ENCOUNTER — Ambulatory Visit (INDEPENDENT_AMBULATORY_CARE_PROVIDER_SITE_OTHER): Payer: No Typology Code available for payment source | Admitting: Physician Assistant

## 2018-09-17 ENCOUNTER — Encounter: Payer: Self-pay | Admitting: Physician Assistant

## 2018-09-17 ENCOUNTER — Other Ambulatory Visit: Payer: Self-pay

## 2018-09-17 VITALS — BP 159/93 | HR 83 | Temp 97.5°F | Ht 72.0 in | Wt 200.0 lb

## 2018-09-17 DIAGNOSIS — K3532 Acute appendicitis with perforation and localized peritonitis, without abscess: Secondary | ICD-10-CM

## 2018-09-17 NOTE — Progress Notes (Signed)
[facility] SURGICAL CLINIC NOTE  09/17/2018  History of Present Illness: Joseph Hodges is a 63 y.o. male with a history of perforated appendicitis with intr-abdominal abscess. In summary, he was discharged on 08/10 and followed up [facility]. His original drain was removed as this fluid collection resolved, although he was found to have additional collection in the RLQ concerning for abscess. He had a new drain placed by IR on 08/20 and he was restarted on ABx (Cirpo + Augmentin x10 days).   Today, he reports that he is feeling the best he has since his admission. No issues with fever, chills, nausea, or emesis. His drain output is minimum, around ~ 1 ml at most. This is primarily serous at this point. No issues with appetite or bowel movements. He is due to follow up [facility] on Thursday for drain management. He has finished his course of Abx.     Past Medical History: Past Medical History:  Diagnosis Date  . Patient denies medical problems      Past Surgical History: Past Surgical History:  Procedure Laterality Date  . FINGER SURGERY Right 2010  . IR RADIOLOGIST EVAL & MGMT  09/05/2018    Home Medications: Prior to Admission medications   Medication Sig Start Date End Date Taking? Authorizing Provider  acetaminophen (TYLENOL) 325 MG tablet Take 650 mg by mouth every 6 (six) hours as needed.   Yes [provider]  B-Complex-C TABS Take 1 tablet by mouth daily.   Yes [provider]  calcium carbonate (TUMS - DOSED IN MG ELEMENTAL CALCIUM) 500 MG chewable tablet Chew 1 tablet by mouth daily.   Yes [provider]  ibuprofen (ADVIL) 200 MG tablet Take 200 mg by mouth every 6 (six) hours as needed.   Yes [provider]  sodium chloride flush (NS) 0.9 % SOLN Place 10 mLs into feeding tube every 12 (twelve) hours. 09/11/18  Yes Duanne Guessannon, Jennifer, MD    Allergies: No Known Allergies  Review of  Systems: Review of Systems  Constitutional: Negative for chills, fever and weight loss.  Gastrointestinal: Negative for abdominal pain, constipation, diarrhea, nausea and vomiting.  Genitourinary: Negative for dysuria and urgency.  All other systems reviewed and are negative.   Physical Exam BP (!) 159/93   Pulse 83   Temp (!) 97.5 F (36.4 C) (Skin)   Ht 6' (1.829 m)   Wt 200 lb (90.7 kg)   SpO2 98%   BMI 27.12 kg/m  CONSTITUTIONAL: well appearing male, NAD HEENT:  Normocephalic, atraumatic, extraocular motion intact. RESPIRATORY: Normal respiratory effort without pathologic use of accessory muscles. GI: The abdomen is soft, non-tender, non distended. JP in RLQ with minimal serous output NEUROLOGIC:  Motor and sensation is grossly normal.  Cranial nerves are grossly intact. PSYCH:  Alert and oriented to person, place and time. Affect is normal.  Labs/Imaging: Reviewed CT Abdomen/Pelvis from 08/20  Assessment and Plan: This is a 63 y.o. male with perforated appendicitis s/p second JP drain for intra-abdominal abscesses, which at this point today appear clinically resolved   - follow [facility] as [facility]   - Continue to monitor and record drain output  - he should follow up in about 1 month with Dr Lady Garyannon to discuss / plan interval appendectomy  Face-to-face time spent with the patient and care providers was 15 minutes, with more than 50% of the time spent counseling, educating, and coordinating care of the  patient.    -- Edison Simon, PA-C [facility] 09/17/2018, 9:38 AM [phone] M-F: 7am - 4pm

## 2018-09-17 NOTE — Patient Instructions (Addendum)
Follow up with Dr Celine Ahr in 1 month as scheduled to discuss interval appendectomy.

## 2018-09-20 ENCOUNTER — Encounter: Payer: Self-pay | Admitting: Radiology

## 2018-09-20 ENCOUNTER — Ambulatory Visit
Admission: RE | Admit: 2018-09-20 | Discharge: 2018-09-20 | Disposition: A | Discharge status: Home / Self Care | Payer: No Typology Code available for payment source | Source: Ambulatory Visit | Attending: General Surgery | Admitting: General Surgery

## 2018-09-20 DIAGNOSIS — K3532 Acute appendicitis with perforation and localized peritonitis, without abscess: Secondary | ICD-10-CM

## 2018-09-20 HISTORY — PX: IR RADIOLOGIST EVAL & MGMT: IMG5224

## 2018-09-20 MED ORDER — IOPAMIDOL (ISOVUE-300) INJECTION 61%
125.0000 mL | Freq: Once | INTRAVENOUS | Status: AC | PRN
  Administered 2018-09-20: 14:00:00 125 mL via INTRAVENOUS

## 2018-09-20 NOTE — Progress Notes (Signed)
Referring Physician(s): Joseph Hodges  Chief Complaint: The patient is seen in follow up today s/p drainage of right lower quadrant abdominal abscess on 09/06/2018  History of present illness: Mr. Joseph Hodges is a 63 year old male with history of perforated appendicitis with associated abscess who underwent left perirectal abscess drainage via transgluteal approach on 08/21/2018.  Drain was subsequently removed on 09/05/2018 following resolution of collection, however CT at that time revealed a new right lower quadrant abdominal/pelvic fluid collection which was drained on 09/06/2018.  Drain fluid cultures grew E. coli, Streptococcus, Bacteroides and Klebsiella.  He was discharged home on antibiotic therapy and completed this course on 09/15/2018. He presents again today for follow-up CT and drain injection.  He states that he has been doing well since latest drain was placed.  He denies fever, chest pain, dyspnea, back pain, nausea, vomiting or bleeding.  He does have some soreness at drain insertion site.  He has been flushing drain twice daily with 5 cc saline.  There has been minimal output from drain over the past several days.   Past Medical History:  Diagnosis Date  . Patient denies medical problems     Past Surgical History:  Procedure Laterality Date  . FINGER SURGERY Right 2010  . IR RADIOLOGIST EVAL & MGMT  09/05/2018    Allergies: Patient has no known allergies.  Medications: Prior to Admission medications   Medication Sig Start Date End Date Taking? Authorizing Provider  acetaminophen (TYLENOL) 325 MG tablet Take 650 mg by mouth every 6 (six) hours as needed.    [provider]  B-Complex-C TABS Take 1 tablet by mouth daily.    [provider]  calcium carbonate (TUMS - DOSED IN MG ELEMENTAL CALCIUM) 500 MG chewable tablet Chew 1 tablet by mouth daily.    [provider]  ibuprofen (ADVIL) 200 MG tablet Take 200 mg by mouth every 6 (six) hours as  needed.    [provider]  sodium chloride flush (NS) 0.9 % SOLN Place 10 mLs into feeding tube every 12 (twelve) hours. 09/11/18   Fredirick Maudlin, MD     No family history on file.  Social History   Socioeconomic History  . Marital status: Married    Spouse name: Not on file  . Number of children: Not on file  . Years of education: Not on file  . Highest education level: Not on file  Occupational History  . Not on file  Social Needs  . Financial resource strain: Not on file  . Food insecurity    Worry: Not on file    Inability: Not on file  . Transportation needs    Medical: Not on file    Non-medical: Not on file  Tobacco Use  . Smoking status: Never Smoker  . Smokeless tobacco: Never Used  Substance and Sexual Activity  . Alcohol use: Not on file  . Drug use: Not on file  . Sexual activity: Not on file  Lifestyle  . Physical activity    Days per week: Not on file    Minutes per session: Not on file  . Stress: Not on file  Relationships  . Social Herbalist on phone: Not on file    Gets together: Not on file    Attends religious service: Not on file    Active member of club or organization: Not on file    Attends meetings of clubs or organizations: Not on file    Relationship status:  Not on file  Other Topics Concern  . Not on file  Social History Narrative  . Not on file     Vital Signs:VSS; AF    Physical Exam awake, alert.  Abdomen soft, nondistended.  Right lower quadrant drain intact, insertion site mildly tender to palpation with minimal erythema.  Small amount of light brown fluid in JP bulb.  Imaging: No results found.  Labs:  CBC: Recent Labs    08/21/18 0446 08/22/18 0753 09/06/18 1110  WBC 9.3 5.0 12.7*  HGB 17.8* 15.3 14.2  HCT 50.9 44.4 42.0  PLT 221 181 286    COAGS: Recent Labs    09/06/18 1110  INR 1.3*  APTT 35    BMP: Recent Labs    08/21/18 0446 08/22/18 0753 08/24/18 0806  NA 131* 133*  137  K 3.6 4.0 3.8  CL 91* 96* 98  CO2 26 25 29   GLUCOSE 180* 128* 123*  BUN 34* 40* 24*  CALCIUM 9.3 8.2* 8.3*  CREATININE 1.05 1.18 0.98  GFRNONAA >60 >60 >60  GFRAA >60 >60 >60    LIVER FUNCTION TESTS: Recent Labs    08/21/18 0446  BILITOT 1.5*  AST 22  ALT 26  ALKPHOS 54  PROT 8.5*  ALBUMIN 4.2    Assessment: 63 year old male with history of perforated appendicitis with associated abscess who underwent left perirectal abscess drainage via transgluteal approach on 08/21/2018.  Drain was subsequently removed on 09/05/2018 following resolution of collection, however CT at that time revealed a new right lower quadrant abdominal/pelvic fluid collection which was drained on 09/06/2018.  Drain fluid cultures grew E. coli, Streptococcus, Bacteroides and Klebsiella.  He was discharged home on antibiotic therapy and completed this course on 09/15/2018. He is currently stable and afebrile.  He has had minimal output from the drain.  Follow-up CT scan today reveals no significant fluid around right lower quadrant drain.  Drain injection reveals no communication with bowel.  Following discussion with Dr. Lowella Hodges decision made to remove drain.  RLQ drain removed in its entirety without immediate complications.  Gauze dressing applied to site.  Site care instructions were reviewed with patient.  He is scheduled to follow-up with his surgeon on 10/11/2018.   Signed: D. Joseph RamaKevin Twain Stenseth, PA-C 09/20/2018, 2:06 PM   Please refer to Dr. Manon Hodges's attestation of this note for management and plan.      Patient ID: Joseph Hodges, male   DOB: 1955/10/04, 63 y.o.   MRN: [id number]

## 2018-10-11 ENCOUNTER — Ambulatory Visit (INDEPENDENT_AMBULATORY_CARE_PROVIDER_SITE_OTHER): Payer: No Typology Code available for payment source | Admitting: General Surgery

## 2018-10-11 ENCOUNTER — Telehealth: Payer: Self-pay | Admitting: General Surgery

## 2018-10-11 ENCOUNTER — Encounter: Payer: Self-pay | Admitting: General Surgery

## 2018-10-11 ENCOUNTER — Other Ambulatory Visit: Payer: Self-pay

## 2018-10-11 VITALS — BP 178/95 | HR 68 | Temp 97.3°F | Ht 74.0 in | Wt 206.0 lb

## 2018-10-11 DIAGNOSIS — K3532 Acute appendicitis with perforation and localized peritonitis, without abscess: Secondary | ICD-10-CM

## 2018-10-11 NOTE — Patient Instructions (Signed)
Patient to be scheduled for surgery

## 2018-10-11 NOTE — H&P (View-Only) (Signed)
Patient ID: Joseph Hodges, male   DOB: 08-01-1955, 63 y.o.   MRN: [id number]  Chief Complaint  Patient presents with  . Follow-up    Ct scan     HPI Joseph Hodges is a 63 y.o. male.   He initially became known to our service when he presented to the emergency department at the beginning of August with what proved to be perforated appendicitis with abscess.  He was treated conservatively, with IV antibiotics and percutaneous drainage.  Subsequent imaging demonstrated resolution of the first abscess, but formation of a second.  This was also treated with percutaneous drainage and antibiotic treatment.  His most recent CT scan was done on September 3, which showed resolution of the second abscess and his drain was removed.  He is here today for follow-up and to discuss scheduling his interval appendectomy.  He reports that the antibiotics "messed with his digestion," but things do seem to be getting better.  He did have some cramping in his right lower quadrant when the drain was removed, but this is not recurred.  He denies any fevers or chills.  No nausea or vomiting.  No significant pain.   Past Medical History:  Diagnosis Date  . Patient denies medical problems     Past Surgical History:  Procedure Laterality Date  . FINGER SURGERY Right 2010  . IR RADIOLOGIST EVAL & MGMT  09/05/2018  . IR RADIOLOGIST EVAL & MGMT  09/20/2018    History reviewed. No pertinent family history.  Social History Social History   Tobacco Use  . Smoking status: Never Smoker  . Smokeless tobacco: Never Used  Substance Use Topics  . Alcohol use: Not on file  . Drug use: Not on file    No Known Allergies  Current Outpatient Medications  Medication Sig Dispense Refill  . acetaminophen (TYLENOL) 325 MG tablet Take 650 mg by mouth every 6 (six) hours as needed.    Marland Kitchen B-Complex-C TABS Take 1 tablet by mouth daily.    . calcium carbonate (TUMS - DOSED IN MG ELEMENTAL CALCIUM) 500 MG chewable tablet Chew 1  tablet by mouth daily.    Marland Kitchen ibuprofen (ADVIL) 200 MG tablet Take 200 mg by mouth every 6 (six) hours as needed.    . sodium chloride flush (NS) 0.9 % SOLN Place 10 mLs into feeding tube every 12 (twelve) hours. 10 Syringe 0   No current facility-administered medications for this visit.     Review of Systems Review of Systems  Gastrointestinal: Positive for constipation and diarrhea.  All other systems reviewed and are negative.   Blood pressure (!) 178/95, pulse 68, temperature (!) 97.3 F (36.3 C), temperature source Skin, height 6\' 2"  (1.88 m), weight 206 lb (93.4 kg), SpO2 98 %.  Body mass index is 26.45 kg/m.  Physical Exam Physical Exam Constitutional:      General: He is not in acute distress.    Appearance: Normal appearance. He is normal weight.  HENT:     Head: Normocephalic and atraumatic.     Nose:     Comments: Wearing a mask secondary to COVID-19 precautions    Mouth/Throat:     Comments: Wearing a mask secondary to COVID-19 precautions Eyes:     General: No scleral icterus.       Right eye: No discharge.        Left eye: No discharge.  Neck:     Comments: No thyromegaly or dominant thyroid masses appreciated Cardiovascular:  Rate and Rhythm: Normal rate and regular rhythm.     Pulses: Normal pulses.  Pulmonary:     Effort: Pulmonary effort is normal.     Breath sounds: Normal breath sounds.  Abdominal:     General: Abdomen is flat. Bowel sounds are normal.     Palpations: Abdomen is soft.     Comments: Very mild tenderness with deep palpation in the right lower quadrant.  There is a small scar from his drain removal site.  Genitourinary:    Comments: Deferred Musculoskeletal:     Right lower leg: No edema.     Left lower leg: No edema.  Lymphadenopathy:     Cervical: No cervical adenopathy.  Skin:    General: Skin is warm and dry.  Neurological:     General: No focal deficit present.     Mental Status: He is alert and oriented to person,  place, and time.  Psychiatric:        Mood and Affect: Mood normal.        Behavior: Behavior normal.     Data Reviewed: I reviewed the electronic medical record from his initial hospitalization for acute perforated appendicitis.  There were a number of interval clinic visits with our physician's assistant, as well which were reviewed, all detailing his course of recuperation from drain placement, antibiotic extensions and drain character.  I also reviewed the radiology findings and notes that resulted in the removal of the first drain and placement of a second drain.  I reviewed the CT scan performed on 3 September.  I am in agreement with the radiologist findings which are copied here:  IMPRESSION: 1. Right lower quadrant abscess has resolved following placement of the percutaneous drain. 2. No new abscess collections. 3. Mild residual stranding in the right lower quadrant and around the appendix. 4. Small pleural-based nodular densities at the lung bases. No follow-up needed if patient is low-risk (and has no known or suspected primary neoplasm). Non-contrast chest CT can be considered in 12 months if patient is high-risk. This recommendation follows the consensus statement: Guidelines for Management of Incidental Pulmonary Nodules Detected on CT Images: From the Fleischner Society 2017; Radiology 2017; 284:228-243.  Assessment This is a 63 year old man who was hospitalized in August with perforated appendicitis.  He has been managed with antibiotics and percutaneous drainage of abscess collections.  These collections have resolved.  His most recent CT scan shows mild residual stranding in the right lower quadrant and around the appendix.  Interval appendectomy is indicated.  Plan I discussed the risks of the operation with him.  These include, but are not limited to, bleeding, infection, need to convert to an open operation, unanticipated findings that may require additional bowel  resection.  He accepts these risks and is interested in proceeding with an interval appendectomy.  We will get him scheduled at a mutually convenient date.  In the interim, he may want to take additional probiotics to assist in regulating his bowels.    Duanne Guess 10/11/2018, 9:25 AM

## 2018-10-11 NOTE — Progress Notes (Addendum)
Patient ID: Joseph Hodges, male   DOB: 08-01-1955, 63 y.o.   MRN: [id number]  Chief Complaint  Patient presents with  . Follow-up    Ct scan     HPI Joseph Hodges is a 63 y.o. male.   He initially became known to our service when he presented to the emergency department at the beginning of August with what proved to be perforated appendicitis with abscess.  He was treated conservatively, with IV antibiotics and percutaneous drainage.  Subsequent imaging demonstrated resolution of the first abscess, but formation of a second.  This was also treated with percutaneous drainage and antibiotic treatment.  His most recent CT scan was done on September 3, which showed resolution of the second abscess and his drain was removed.  He is here today for follow-up and to discuss scheduling his interval appendectomy.  He reports that the antibiotics "messed with his digestion," but things do seem to be getting better.  He did have some cramping in his right lower quadrant when the drain was removed, but this is not recurred.  He denies any fevers or chills.  No nausea or vomiting.  No significant pain.   Past Medical History:  Diagnosis Date  . Patient denies medical problems     Past Surgical History:  Procedure Laterality Date  . FINGER SURGERY Right 2010  . IR RADIOLOGIST EVAL & MGMT  09/05/2018  . IR RADIOLOGIST EVAL & MGMT  09/20/2018    History reviewed. No pertinent family history.  Social History Social History   Tobacco Use  . Smoking status: Never Smoker  . Smokeless tobacco: Never Used  Substance Use Topics  . Alcohol use: Not on file  . Drug use: Not on file    No Known Allergies  Current Outpatient Medications  Medication Sig Dispense Refill  . acetaminophen (TYLENOL) 325 MG tablet Take 650 mg by mouth every 6 (six) hours as needed.    Marland Kitchen B-Complex-C TABS Take 1 tablet by mouth daily.    . calcium carbonate (TUMS - DOSED IN MG ELEMENTAL CALCIUM) 500 MG chewable tablet Chew 1  tablet by mouth daily.    Marland Kitchen ibuprofen (ADVIL) 200 MG tablet Take 200 mg by mouth every 6 (six) hours as needed.    . sodium chloride flush (NS) 0.9 % SOLN Place 10 mLs into feeding tube every 12 (twelve) hours. 10 Syringe 0   No current facility-administered medications for this visit.     Review of Systems Review of Systems  Gastrointestinal: Positive for constipation and diarrhea.  All other systems reviewed and are negative.   Blood pressure (!) 178/95, pulse 68, temperature (!) 97.3 F (36.3 C), temperature source Skin, height 6\' 2"  (1.88 m), weight 206 lb (93.4 kg), SpO2 98 %.  Body mass index is 26.45 kg/m.  Physical Exam Physical Exam Constitutional:      General: He is not in acute distress.    Appearance: Normal appearance. He is normal weight.  HENT:     Head: Normocephalic and atraumatic.     Nose:     Comments: Wearing a mask secondary to COVID-19 precautions    Mouth/Throat:     Comments: Wearing a mask secondary to COVID-19 precautions Eyes:     General: No scleral icterus.       Right eye: No discharge.        Left eye: No discharge.  Neck:     Comments: No thyromegaly or dominant thyroid masses appreciated Cardiovascular:  Rate and Rhythm: Normal rate and regular rhythm.     Pulses: Normal pulses.  Pulmonary:     Effort: Pulmonary effort is normal.     Breath sounds: Normal breath sounds.  Abdominal:     General: Abdomen is flat. Bowel sounds are normal.     Palpations: Abdomen is soft.     Comments: Very mild tenderness with deep palpation in the right lower quadrant.  There is a small scar from his drain removal site.  Genitourinary:    Comments: Deferred Musculoskeletal:     Right lower leg: No edema.     Left lower leg: No edema.  Lymphadenopathy:     Cervical: No cervical adenopathy.  Skin:    General: Skin is warm and dry.  Neurological:     General: No focal deficit present.     Mental Status: He is alert and oriented to person,  place, and time.  Psychiatric:        Mood and Affect: Mood normal.        Behavior: Behavior normal.     Data Reviewed: I reviewed the electronic medical record from his initial hospitalization for acute perforated appendicitis.  There were a number of interval clinic visits with our physician's assistant, as well which were reviewed, all detailing his course of recuperation from drain placement, antibiotic extensions and drain character.  I also reviewed the radiology findings and notes that resulted in the removal of the first drain and placement of a second drain.  I reviewed the CT scan performed on 3 September.  I am in agreement with the radiologist findings which are copied here:  IMPRESSION: 1. Right lower quadrant abscess has resolved following placement of the percutaneous drain. 2. No new abscess collections. 3. Mild residual stranding in the right lower quadrant and around the appendix. 4. Small pleural-based nodular densities at the lung bases. No follow-up needed if patient is low-risk (and has no known or suspected primary neoplasm). Non-contrast chest CT can be considered in 12 months if patient is high-risk. This recommendation follows the consensus statement: Guidelines for Management of Incidental Pulmonary Nodules Detected on CT Images: From the Fleischner Society 2017; Radiology 2017; 284:228-243.  Assessment This is a 63-year-old man who was hospitalized in August with perforated appendicitis.  He has been managed with antibiotics and percutaneous drainage of abscess collections.  These collections have resolved.  His most recent CT scan shows mild residual stranding in the right lower quadrant and around the appendix.  Interval appendectomy is indicated.  Plan I discussed the risks of the operation with him.  These include, but are not limited to, bleeding, infection, need to convert to an open operation, unanticipated findings that may require additional bowel  resection.  He accepts these risks and is interested in proceeding with an interval appendectomy.  We will get him scheduled at a mutually convenient date.  In the interim, he may want to take additional probiotics to assist in regulating his bowels.    Shunna Mikaelian 10/11/2018, 9:25 AM   

## 2018-10-11 NOTE — Telephone Encounter (Signed)
Pt has been advised of pre admission date/time, Covid Testing date and Surgery date.  Surgery Date: 11/07/18 with Dr Barry Dienes appendectomy.  Preadmission Testing Date: 10/30/18 between 8-1:00pm-phone interview.  Covid Testing Date: 11/02/18 between 8-10:30am - patient advised to go to the [place] ([place]  [employer] Video sent via [employer] [facility] and [employer].  Patient has been made aware to call [phone], between 1-3:00pm the day before surgery, to find out what time to arrive.

## 2018-10-12 ENCOUNTER — Other Ambulatory Visit: Payer: Self-pay | Admitting: General Surgery

## 2018-10-12 DIAGNOSIS — K3532 Acute appendicitis with perforation and localized peritonitis, without abscess: Secondary | ICD-10-CM

## 2018-10-30 ENCOUNTER — Encounter
Admission: RE | Admit: 2018-10-30 | Discharge: 2018-10-30 | Disposition: A | Discharge status: Home / Self Care | Payer: No Typology Code available for payment source | Source: Ambulatory Visit | Attending: General Surgery | Admitting: General Surgery

## 2018-10-30 ENCOUNTER — Other Ambulatory Visit: Payer: Self-pay

## 2018-10-30 DIAGNOSIS — Z01812 Encounter for preprocedural laboratory examination: Secondary | ICD-10-CM | POA: Insufficient documentation

## 2018-10-30 HISTORY — DX: Gastro-esophageal reflux disease without esophagitis: K21.9

## 2018-10-30 NOTE — Patient Instructions (Signed)
Your procedure is scheduled on: 11-07-18 [facility] Report to Same Day Surgery 2nd floor medical mall [facility] Entrance-take elevator on left to 2nd floor.  Check in with surgery information desk.) To find out your arrival time please call [phone] between 1PM - 3PM on 11-06-18 TUESDAY  Remember: Instructions that are not followed completely may result in serious medical risk, up to and including death, or upon the discretion of your surgeon and anesthesiologist your surgery may need to be rescheduled.    _x___ 1. Do not eat food after midnight the night before your procedure. NO GUM OR CANDY AFTER MIDNIGHT. You may drink clear liquids up to 2 hours before you are scheduled to arrive at the hospital for your procedure.  Do not drink clear liquids within 2 hours of your scheduled arrival to the hospital.  Clear liquids include  --Water or Apple juice without pulp  --Gatorade  --Black Coffee or Clear Tea (No milk, no creamers, do not add anything to the coffee or Tea)   ____Ensure clear carbohydrate drink on the way to the hospital for bariatric patients  ____Ensure clear carbohydrate drink 3 hours before surgery.     __x__ 2. No Alcohol for 24 hours before or after surgery.   __x__3. No Smoking or e-cigarettes for 24 prior to surgery.  Do not use any chewable tobacco products for at least 6 hour prior to surgery   ____  4. Bring all medications with you on the day of surgery if instructed.    __x__ 5. Notify your doctor if there is any change in your medical condition     (cold, fever, infections).    x___6. On the morning of surgery brush your teeth with toothpaste and water.  You may rinse your mouth with mouth wash if you wish.  Do not swallow any toothpaste or mouthwash.   Do not wear jewelry, make-up, hairpins, clips or nail polish.  Do not wear lotions, powders, or perfumes.   Do not shave 48 hours prior to surgery. Men may shave face and neck.  Do not bring valuables  to the hospital.    [facility] is not responsible for any belongings or valuables.               Contacts, dentures or bridgework may not be worn into surgery.  Leave your suitcase in the car. After surgery it may be brought to your room.  For patients admitted to the hospital, discharge time is determined by your  treatment team.  _  Patients discharged the day of surgery will not be allowed to drive home.  You will need someone to drive you home and stay with you the night of your procedure.    Please read over the following fact sheets that you were given:   [facility] Preparing for Surgery and or MRSA Information   ____ Take anti-hypertensive listed below, cardiac, seizure, asthma, anti-reflux and psychiatric medicines. These include:  1. NONE  2.  3.  4.  5.  6.  ____Fleets enema or Magnesium Citrate as directed.   _x___ Use CHG Soap or sage wipes as directed on instruction sheet   ____ Use inhalers on the day of surgery and bring to hospital day of surgery  ____ Stop Metformin and Janumet 2 days prior to surgery.    ____ Take 1/2 of usual insulin dose the night before surgery and none on the morning surgery.   ____ Follow recommendations from Cardiologist, Pulmonologist or  PCP regarding stopping Aspirin, Coumadin, Plavix ,Eliquis, Effient, or Pradaxa, and Pletal.  X____Stop Anti-inflammatories such as Advil, Aleve, Ibuprofen, Motrin, Naproxen, Naprosyn, Goodies powders or aspirin products NOW-OK to take Tylenol    ____ Stop supplements until after surgery.    ____ Bring C-Pap to the hospital.

## 2018-11-02 ENCOUNTER — Other Ambulatory Visit: Payer: Self-pay

## 2018-11-02 ENCOUNTER — Other Ambulatory Visit
Admission: RE | Admit: 2018-11-02 | Discharge: 2018-11-02 | Disposition: A | Discharge status: Home / Self Care | Payer: No Typology Code available for payment source | Source: Ambulatory Visit | Attending: General Surgery | Admitting: General Surgery

## 2018-11-02 DIAGNOSIS — Z20828 Contact with and (suspected) exposure to other viral communicable diseases: Secondary | ICD-10-CM | POA: Diagnosis not present

## 2018-11-02 LAB — SARS CORONAVIRUS 2 (TAT 6-24 HRS): SARS Coronavirus 2: NEGATIVE

## 2018-11-07 ENCOUNTER — Other Ambulatory Visit: Payer: Self-pay

## 2018-11-07 ENCOUNTER — Encounter
Admission: RE | Disposition: A | Discharge status: Home / Self Care | Payer: Self-pay | Source: Home / Self Care | Attending: General Surgery

## 2018-11-07 ENCOUNTER — Ambulatory Visit: Payer: No Typology Code available for payment source | Admitting: Anesthesiology

## 2018-11-07 ENCOUNTER — Ambulatory Visit
Admission: RE | Admit: 2018-11-07 | Discharge: 2018-11-07 | Disposition: A | Discharge status: Home / Self Care | Payer: No Typology Code available for payment source | Attending: General Surgery | Admitting: General Surgery

## 2018-11-07 ENCOUNTER — Encounter: Payer: Self-pay | Admitting: *Deleted

## 2018-11-07 DIAGNOSIS — K3580 Unspecified acute appendicitis: Secondary | ICD-10-CM | POA: Diagnosis not present

## 2018-11-07 DIAGNOSIS — K3532 Acute appendicitis with perforation and localized peritonitis, without abscess: Secondary | ICD-10-CM | POA: Diagnosis not present

## 2018-11-07 DIAGNOSIS — K219 Gastro-esophageal reflux disease without esophagitis: Secondary | ICD-10-CM | POA: Diagnosis not present

## 2018-11-07 HISTORY — PX: LAPAROSCOPIC APPENDECTOMY: SHX408

## 2018-11-07 SURGERY — APPENDECTOMY, LAPAROSCOPIC
Anesthesia: General | Site: Abdomen

## 2018-11-07 MED ORDER — FAMOTIDINE 20 MG PO TABS
ORAL_TABLET | ORAL | Status: AC
  Administered 2018-11-07: 20 mg via ORAL
  Filled 2018-11-07: qty 1

## 2018-11-07 MED ORDER — KETOROLAC TROMETHAMINE 30 MG/ML IJ SOLN: 30.0000 mg | Freq: Once | INTRAMUSCULAR | Status: DC | PRN

## 2018-11-07 MED ORDER — MIDAZOLAM HCL 2 MG/2ML IJ SOLN
INTRAMUSCULAR | Status: AC
  Filled 2018-11-07: qty 2

## 2018-11-07 MED ORDER — FENTANYL CITRATE (PF) 100 MCG/2ML IJ SOLN
INTRAMUSCULAR | Status: DC | PRN
  Administered 2018-11-07: 150 ug via INTRAVENOUS

## 2018-11-07 MED ORDER — GABAPENTIN 300 MG PO CAPS
300.0000 mg | ORAL_CAPSULE | ORAL | Status: AC
  Administered 2018-11-07: 08:00:00 300 mg via ORAL

## 2018-11-07 MED ORDER — LIDOCAINE-EPINEPHRINE (PF) 1 %-1:200000 IJ SOLN
INTRAMUSCULAR | Status: DC | PRN
  Administered 2018-11-07: 18 mL via INTRAMUSCULAR

## 2018-11-07 MED ORDER — CELECOXIB 200 MG PO CAPS
200.0000 mg | ORAL_CAPSULE | ORAL | Status: AC
  Administered 2018-11-07: 08:00:00 200 mg via ORAL

## 2018-11-07 MED ORDER — ROCURONIUM BROMIDE 100 MG/10ML IV SOLN
INTRAVENOUS | Status: DC | PRN
  Administered 2018-11-07: 50 mg via INTRAVENOUS

## 2018-11-07 MED ORDER — EPHEDRINE SULFATE 50 MG/ML IJ SOLN
INTRAMUSCULAR | Status: DC | PRN
  Administered 2018-11-07: 15 mg via INTRAVENOUS

## 2018-11-07 MED ORDER — LACTATED RINGERS IV SOLN: INTRAVENOUS | Status: DC

## 2018-11-07 MED ORDER — LIDOCAINE-EPINEPHRINE 1 %-1:100000 IJ SOLN
INTRAMUSCULAR | Status: AC
  Filled 2018-11-07: qty 1

## 2018-11-07 MED ORDER — ONDANSETRON HCL 4 MG/2ML IJ SOLN
INTRAMUSCULAR | Status: DC | PRN
  Administered 2018-11-07: 4 mg via INTRAVENOUS

## 2018-11-07 MED ORDER — PROPOFOL 10 MG/ML IV BOLUS
INTRAVENOUS | Status: AC
  Filled 2018-11-07: qty 20

## 2018-11-07 MED ORDER — GABAPENTIN 300 MG PO CAPS
ORAL_CAPSULE | ORAL | Status: AC
  Administered 2018-11-07: 300 mg via ORAL
  Filled 2018-11-07: qty 1

## 2018-11-07 MED ORDER — HYDROCODONE-ACETAMINOPHEN 5-325 MG PO TABS: 1.0000 | ORAL_TABLET | Freq: Four times a day (QID) | ORAL | 0 refills | Status: DC | PRN

## 2018-11-07 MED ORDER — LIDOCAINE HCL (CARDIAC) PF 100 MG/5ML IV SOSY
PREFILLED_SYRINGE | INTRAVENOUS | Status: DC | PRN
  Administered 2018-11-07: 100 mg via INTRAVENOUS

## 2018-11-07 MED ORDER — MEPERIDINE HCL 50 MG/ML IJ SOLN: 6.2500 mg | INTRAMUSCULAR | Status: DC | PRN

## 2018-11-07 MED ORDER — FENTANYL CITRATE (PF) 100 MCG/2ML IJ SOLN: 25.0000 ug | INTRAMUSCULAR | Status: DC | PRN

## 2018-11-07 MED ORDER — FENTANYL CITRATE (PF) 250 MCG/5ML IJ SOLN
INTRAMUSCULAR | Status: AC
  Filled 2018-11-07: qty 5

## 2018-11-07 MED ORDER — IBUPROFEN 800 MG PO TABS: 800.0000 mg | ORAL_TABLET | Freq: Three times a day (TID) | ORAL | 0 refills | Status: DC | PRN

## 2018-11-07 MED ORDER — LACTATED RINGERS IV SOLN
INTRAVENOUS | Status: DC | PRN
  Administered 2018-11-07: 10:00:00 via INTRAVENOUS

## 2018-11-07 MED ORDER — CEFAZOLIN SODIUM-DEXTROSE 2-4 GM/100ML-% IV SOLN
INTRAVENOUS | Status: AC
  Filled 2018-11-07: qty 100

## 2018-11-07 MED ORDER — MIDAZOLAM HCL 2 MG/2ML IJ SOLN
INTRAMUSCULAR | Status: DC | PRN
  Administered 2018-11-07: 2 mg via INTRAVENOUS

## 2018-11-07 MED ORDER — SUGAMMADEX SODIUM 200 MG/2ML IV SOLN
INTRAVENOUS | Status: DC | PRN
  Administered 2018-11-07: 200 mg via INTRAVENOUS

## 2018-11-07 MED ORDER — CELECOXIB 200 MG PO CAPS
ORAL_CAPSULE | ORAL | Status: AC
  Administered 2018-11-07: 200 mg via ORAL
  Filled 2018-11-07: qty 1

## 2018-11-07 MED ORDER — BUPIVACAINE HCL (PF) 0.25 % IJ SOLN
INTRAMUSCULAR | Status: AC
  Filled 2018-11-07: qty 30

## 2018-11-07 MED ORDER — PHENYLEPHRINE HCL (PRESSORS) 10 MG/ML IV SOLN: INTRAVENOUS | Status: DC | PRN

## 2018-11-07 MED ORDER — CHLORHEXIDINE GLUCONATE CLOTH 2 % EX PADS: 6.0000 | MEDICATED_PAD | Freq: Once | CUTANEOUS | Status: DC

## 2018-11-07 MED ORDER — HYDROCODONE-ACETAMINOPHEN 7.5-325 MG PO TABS
ORAL_TABLET | ORAL | Status: AC
  Filled 2018-11-07: qty 1

## 2018-11-07 MED ORDER — ACETAMINOPHEN 500 MG PO TABS
ORAL_TABLET | ORAL | Status: AC
  Administered 2018-11-07: 1000 mg via ORAL
  Filled 2018-11-07: qty 2

## 2018-11-07 MED ORDER — DEXAMETHASONE SODIUM PHOSPHATE 10 MG/ML IJ SOLN
INTRAMUSCULAR | Status: DC | PRN
  Administered 2018-11-07: 10 mg via INTRAVENOUS

## 2018-11-07 MED ORDER — ACETAMINOPHEN 160 MG/5ML PO SOLN
325.0000 mg | ORAL | Status: DC | PRN
  Filled 2018-11-07: qty 20.3

## 2018-11-07 MED ORDER — HYDROCODONE-ACETAMINOPHEN 7.5-325 MG PO TABS
1.0000 | ORAL_TABLET | Freq: Once | ORAL | Status: AC | PRN
  Administered 2018-11-07: 1 via ORAL
  Filled 2018-11-07: qty 1

## 2018-11-07 MED ORDER — DEXMEDETOMIDINE HCL 200 MCG/2ML IV SOLN
INTRAVENOUS | Status: DC | PRN
  Administered 2018-11-07: 12 ug via INTRAVENOUS

## 2018-11-07 MED ORDER — PROMETHAZINE HCL 25 MG/ML IJ SOLN: 6.2500 mg | INTRAMUSCULAR | Status: DC | PRN

## 2018-11-07 MED ORDER — ACETAMINOPHEN 500 MG PO TABS
1000.0000 mg | ORAL_TABLET | ORAL | Status: AC
  Administered 2018-11-07: 08:00:00 1000 mg via ORAL

## 2018-11-07 MED ORDER — ACETAMINOPHEN 325 MG PO TABS: 325.0000 mg | ORAL_TABLET | ORAL | Status: DC | PRN

## 2018-11-07 MED ORDER — CEFAZOLIN SODIUM-DEXTROSE 2-4 GM/100ML-% IV SOLN
2.0000 g | INTRAVENOUS | Status: AC
  Administered 2018-11-07: 2 g via INTRAVENOUS

## 2018-11-07 MED ORDER — PROPOFOL 10 MG/ML IV BOLUS
INTRAVENOUS | Status: DC | PRN
  Administered 2018-11-07: 180 mg via INTRAVENOUS

## 2018-11-07 MED ORDER — FAMOTIDINE 20 MG PO TABS
20.0000 mg | ORAL_TABLET | Freq: Once | ORAL | Status: AC
  Administered 2018-11-07: 08:00:00 20 mg via ORAL

## 2018-11-07 SURGICAL SUPPLY — 56 items
APPLICATOR COTTON TIP 6 STRL (MISCELLANEOUS) ×1 IMPLANT
APPLICATOR COTTON TIP 6IN STRL (MISCELLANEOUS)
APPLIER CLIP 5 13 M/L LIGAMAX5 (MISCELLANEOUS) ×2
BLADE CLIPPER SURG (BLADE) ×1 IMPLANT
BLADE SURG SZ11 CARB STEEL (BLADE) ×2 IMPLANT
CANISTER SUCT 1200ML W/VALVE (MISCELLANEOUS) ×2 IMPLANT
CHLORAPREP W/TINT 26 (MISCELLANEOUS) ×2 IMPLANT
CLIP APPLIE 5 13 M/L LIGAMAX5 (MISCELLANEOUS) IMPLANT
COVER WAND RF STERILE (DRAPES) ×2 IMPLANT
CUTTER FLEX LINEAR 45M (STAPLE) ×2 IMPLANT
DEFOGGER SCOPE WARMER CLEARIFY (MISCELLANEOUS) ×2 IMPLANT
DERMABOND ADVANCED (GAUZE/BANDAGES/DRESSINGS) ×1
DERMABOND ADVANCED .7 DNX12 (GAUZE/BANDAGES/DRESSINGS) ×1 IMPLANT
ELECT CAUTERY BLADE 6.4 (BLADE) ×2 IMPLANT
ELECT CAUTERY BLADE TIP 2.5 (TIP) ×2
ELECT REM PT RETURN 9FT ADLT (ELECTROSURGICAL) ×2
ELECTRODE CAUTERY BLDE TIP 2.5 (TIP) ×1 IMPLANT
ELECTRODE REM PT RTRN 9FT ADLT (ELECTROSURGICAL) ×1 IMPLANT
GLOVE BIO SURGEON STRL SZ 6.5 (GLOVE) ×2 IMPLANT
GLOVE INDICATOR 7.0 STRL GRN (GLOVE) ×4 IMPLANT
GOWN STRL REUS W/ TWL LRG LVL3 (GOWN DISPOSABLE) ×2 IMPLANT
GOWN STRL REUS W/TWL LRG LVL3 (GOWN DISPOSABLE) ×2
GRASPER SUT TROCAR 14GX15 (MISCELLANEOUS) ×1 IMPLANT
IRRIGATION STRYKERFLOW (MISCELLANEOUS) ×1 IMPLANT
IRRIGATOR STRYKERFLOW (MISCELLANEOUS) ×2
IV NS 1000ML (IV SOLUTION) ×1
IV NS 1000ML BAXH (IV SOLUTION) ×1 IMPLANT
KIT TURNOVER KIT A (KITS) ×2 IMPLANT
KITTNER LAPARASCOPIC 5X40 (MISCELLANEOUS) ×2 IMPLANT
LABEL OR SOLS (LABEL) ×2 IMPLANT
LIGASURE LAP MARYLAND 5MM 37CM (ELECTROSURGICAL) IMPLANT
NEEDLE HYPO 22GX1.5 SAFETY (NEEDLE) ×2 IMPLANT
NS IRRIG 500ML POUR BTL (IV SOLUTION) ×2 IMPLANT
PACK LAP CHOLECYSTECTOMY (MISCELLANEOUS) ×2 IMPLANT
PENCIL ELECTRO HAND CTR (MISCELLANEOUS) ×2 IMPLANT
POUCH SPECIMEN RETRIEVAL 10MM (ENDOMECHANICALS) ×2 IMPLANT
RELOAD 45 VASCULAR/THIN (ENDOMECHANICALS) IMPLANT
RELOAD STAPLE 45 2.5 WHT GRN (ENDOMECHANICALS) IMPLANT
RELOAD STAPLE 45 3.5 BLU ETS (ENDOMECHANICALS) ×1 IMPLANT
RELOAD STAPLE TA45 3.5 REG BLU (ENDOMECHANICALS) ×2 IMPLANT
SCISSORS METZENBAUM CVD 33 (INSTRUMENTS) ×2 IMPLANT
SET TUBE SMOKE EVAC HIGH FLOW (TUBING) ×2 IMPLANT
SHEARS HARMONIC ACE PLUS 36CM (ENDOMECHANICALS) ×2 IMPLANT
SLEEVE ADV FIXATION 5X100MM (TROCAR) ×4 IMPLANT
STRIP CLOSURE SKIN 1/2X4 (GAUZE/BANDAGES/DRESSINGS) ×2 IMPLANT
SUT MNCRL 4-0 (SUTURE) ×1
SUT MNCRL 4-0 27XMFL (SUTURE) ×1
SUT VIC AB 3-0 SH 27 (SUTURE) ×2
SUT VIC AB 3-0 SH 27X BRD (SUTURE) ×1 IMPLANT
SUT VICRYL 0 AB UR-6 (SUTURE) ×2 IMPLANT
SUTURE MNCRL 4-0 27XMF (SUTURE) ×1 IMPLANT
TRAY FOLEY MTR SLVR 16FR STAT (SET/KITS/TRAYS/PACK) ×2 IMPLANT
TROCAR 130MM GELPORT  DAV (MISCELLANEOUS) ×2 IMPLANT
TROCAR ADV FIXATION 12X100MM (TROCAR) IMPLANT
TROCAR BALLN GELPORT 12X130M (ENDOMECHANICALS) ×1 IMPLANT
TROCAR Z-THREAD OPTICAL 5X100M (TROCAR) ×2 IMPLANT

## 2018-11-07 NOTE — Anesthesia Preprocedure Evaluation (Signed)
Anesthesia Evaluation  Patient identified by MRN, date of birth, ID band Patient awake    Reviewed: Allergy & Precautions, H&P , NPO status , reviewed documented beta blocker date and time   Airway Mallampati: II  TM Distance: >3 FB Neck ROM: full    Dental  (+) Chipped   Pulmonary    Pulmonary exam normal        Cardiovascular Normal cardiovascular exam     Neuro/Psych    GI/Hepatic GERD  Medicated and Controlled,  Endo/Other    Renal/GU      Musculoskeletal   Abdominal   Peds  Hematology   Anesthesia Other Findings Past Medical History: No date: GERD (gastroesophageal reflux disease) No date: Patient denies medical problems Past Surgical History: 2010: FINGER SURGERY; Right 09/05/2018: IR RADIOLOGIST EVAL & MGMT 09/20/2018: IR RADIOLOGIST EVAL & MGMT BMI    Body Mass Index: 27.05 kg/m     Reproductive/Obstetrics                             Anesthesia Physical Anesthesia Plan  ASA: II  Anesthesia Plan: General   Post-op Pain Management:    Induction: Intravenous  PONV Risk Score and Plan: 3 and Ondansetron, Treatment may vary due to age or medical condition, Midazolam and Dexamethasone  Airway Management Planned: Oral ETT  Additional Equipment:   Intra-op Plan:   Post-operative Plan: Extubation in OR  Informed Consent: I have reviewed the patients History and Physical, chart, labs and discussed the procedure including the risks, benefits and alternatives for the proposed anesthesia with the patient or authorized representative who has indicated his/her understanding and acceptance.     Dental Advisory Given  Plan Discussed with: CRNA  Anesthesia Plan Comments:         Anesthesia Quick Evaluation

## 2018-11-07 NOTE — Anesthesia Procedure Notes (Signed)
Procedure Name: Intubation Date/Time: 11/07/2018 10:02 AM Performed by: Justus Memory, CRNA Pre-anesthesia Checklist: Patient identified, Patient being monitored, Timeout performed, Emergency Drugs available and Suction available Patient Re-evaluated:Patient Re-evaluated prior to induction Oxygen Delivery Method: Circle system utilized Preoxygenation: Pre-oxygenation with 100% oxygen Induction Type: IV induction Ventilation: Mask ventilation with difficulty and Oral airway inserted - appropriate to patient size Laryngoscope Size: Mac, 3 and McGraph Grade View: Grade I Tube type: Oral Tube size: 7.0 mm Number of attempts: 1 Airway Equipment and Method: Stylet and Video-laryngoscopy Placement Confirmation: ETT inserted through vocal cords under direct vision,  positive ETCO2 and breath sounds checked- equal and bilateral Secured at: 21 cm Tube secured with: Tape Dental Injury: Teeth and Oropharynx as per pre-operative assessment  Difficulty Due To: Difficulty was anticipated and Difficult Airway- due to anterior larynx

## 2018-11-07 NOTE — Op Note (Addendum)
Operative Note  Laparoscopic Appendectomy   Marselino Halter Date of operation:  11/07/2018  Indications: The patient presented several months ago with perforated appendicitis and an abscess.  He was treated with percutaneous drainage and antibiotics.  He developed another abscess which was similarly treated.  He has had resolution of his abscesses and is now here for interval appendectomy.  Pre-operative Diagnosis: Acute appendicitis with peritoneal abscess  Post-operative Diagnosis: Same  Surgeon: Fredirick Maudlin, MD  Anesthesia: GETA  Findings: The right lower quadrant was filled with a combination of both filmy and dense adhesions.  The appendix itself was thickened and erythematous.  Estimated Blood Loss: 10 cc         Specimens: appendix         Complications: None immediately apparent  Procedure Details  The patient was seen again in the preop area. The options of surgery versus observation were reviewed with the patient and/or family. The risks of bleeding, infection, recurrence of symptoms, negative laparoscopy, potential for an open procedure, bowel injury, abscess or infection, were all reviewed as well. The patient was taken to Operating Room, identified as Training and development officer and the procedure verified as laparoscopic appendectomy. A time out was performed and the above information confirmed.  The patient was placed in the supine position and general anesthesia was induced.  Antibiotic prophylaxis was administered and VTE prophylaxis was in place. A Foley catheter was placed by the nursing staff.   The abdomen was prepped and draped in a sterile fashion. A supraumbilical incision was made. A cutdown technique was used to enter the abdominal cavity. Two vicryl stitches were placed on the fascia and a Hasson trocar inserted. Pneumoperitoneum obtained. Two 5 mm ports were placed under direct visualization.  There were extensive adhesions in the right lower quadrant.  Approximately 1  hour of adhesiolysis was required to free the bowel and cecum such that we could clearly identify structures.  The appendix was identified and found to be inflamed.  There was no residual abscess. The appendix was carefully dissected. The mesoappendix was divided withHarmonic scalpel. The base of the appendix was dissected out and divided with a standard load Endo GIA.The appendix was placed in a Endo Catch bag and removed via the Hasson port. The right lower quadrant and pelvis was then irrigated with normal saline which was then aspirated. The right lower quadrant was inspected there was no sign of bleeding or bowel injury therefore pneumoperitoneum was released, all ports were removed.  The umbilical fascia was closed with 0 Vicryl interrupted sutures and the skin incisions were approximated with subcuticular 4-0 Monocryl. Dermabond was applied The patient tolerated the procedure well and there were no immediately apparent complications . The sponge lap and needle count were correct at the end of the procedure.  The patient was taken to the recovery room in stable condition.   Fredirick Maudlin, MD, FACS

## 2018-11-07 NOTE — Anesthesia Post-op Follow-up Note (Signed)
Anesthesia QCDR form completed.        

## 2018-11-07 NOTE — Interval H&P Note (Signed)
History and Physical Interval Note:  11/07/2018 9:43 AM  Joseph Hodges  has presented today for surgery, with the diagnosis of Hx of perforated appendicitis.  The various methods of treatment have been discussed with the patient and family. After consideration of risks, benefits and other options for treatment, the patient has consented to  Procedure(s): APPENDECTOMY LAPAROSCOPIC (N/A) as a surgical intervention.  The patient's history has been reviewed, patient examined, no change in status, stable for surgery.  I have reviewed the patient's chart and labs.  Questions were answered to the patient's satisfaction.     Fredirick Maudlin

## 2018-11-07 NOTE — Discharge Instructions (Signed)
Laparoscopic Appendectomy, Adult, Care After °This sheet gives you information about how to care for yourself after your procedure. Your health care provider may also give you more specific instructions. If you have problems or questions, contact your health care provider. °What can I expect after the procedure? °After the procedure, it is common to have: °· Little energy for normal activities. °· Mild pain in the area where the incisions were made. °· Difficulty passing stool (constipation). This can be caused by: °? Pain medicine. °? A decrease in your activity. °Follow these instructions at home: °Medicines °· Take over-the-counter and prescription medicines only as told by your health care provider. °· If you were prescribed an antibiotic medicine, take it as told by your health care provider. Do not stop taking the antibiotic even if you start to feel better. °· Do not drive or use heavy machinery while taking prescription pain medicine. °· Ask your health care provider if the medicine prescribed to you can cause constipation. You may need to take steps to prevent or treat constipation, such as: °? Drink enough fluid to keep your urine pale yellow. °? Take over-the-counter or prescription medicines. °? Eat foods that are high in fiber, such as beans, whole grains, and fresh fruits and vegetables. °? Limit foods that are high in fat and processed sugars, such as fried or sweet foods. °Incision care ° °· Follow instructions from your health care provider about how to take care of your incisions. Make sure you: °? Wash your hands with soap and water before and after you change your bandage (dressing). If soap and water are not available, use hand sanitizer. °? Change your dressing as told by your health care provider. °? Leave stitches (sutures), skin glue, or adhesive strips in place. These skin closures may need to stay in place for 2 weeks or longer. If adhesive strip edges start to loosen and curl up, you may  trim the loose edges. Do not remove adhesive strips completely unless your health care provider tells you to do that. °· Check your incision areas every day for signs of infection. Check for: °? Redness, swelling, or pain. °? Fluid or blood. °? Warmth. °? Pus or a bad smell. °Bathing °· Keep your incisions clean and dry. Clean them as often as told by your health care provider. To do this: °1. Gently wash the incisions with soap and water. °2. Rinse the incisions with water to remove all soap. °3. Pat the incisions dry with a clean towel. Do not rub the incisions. °· Do not take baths, swim, or use a hot tub for 2 weeks, or until your health care provider approves. You may take showers after 48 hours. °Activity ° °· Do not drive for 24 hours if you were given a sedative during your procedure. °· Rest after the procedure. Return to your normal activities as told by your health care provider. Ask your health care provider what activities are safe for you. °· For 3 weeks, or for as long as told by your health care provider: °? Do not lift anything that is heavier than 10 lb (4.5 kg), or the limit that you are told. °? Do not play contact sports. °General instructions °· If you were sent home with a drain, follow instructions from your health care provider about how to care for it. °· Take deep breaths. This helps to prevent your lungs from developing an infection (pneumonia). °· Keep all follow-up visits as told by your health   care provider. This is important. Contact a health care provider if:  You have redness, swelling, or pain around an incision.  You have fluid or blood coming from an incision.  Your incision feels warm to the touch.  You have pus or a bad smell coming from an incision or dressing.  Your incision edges break open after your sutures have been removed.  You have increasing pain in your shoulders.  You feel dizzy or you faint.  You develop shortness of breath.  You keep feeling  nauseous or you are vomiting.  You have diarrhea or you cannot control your bowel functions.  You lose your appetite.  You develop swelling or pain in your legs.  You develop a rash. Get help right away if you have:  A fever.  Difficulty breathing.  Sharp pains in your chest. Summary  After a laparoscopic appendectomy, it is common to have little energy for normal activities, mild pain in the area of the incisions, and constipation.  Infection is the most common complication after this procedure. Follow your health care provider's instructions about caring for yourself after the procedure.  Rest after the procedure. Return to your normal activities as told by your health care provider.  Contact your health care provider if you notice signs of infection around your incisions or you develop shortness of breath. Get help right away if you have a fever, chest pain, or difficulty breathing. This information is not intended to replace advice given to you by your health care provider. Make sure you discuss any questions you have with your health care provider. Document Released: 01/03/2005 Document Revised: 07/06/2017 Document Reviewed: 07/06/2017 Elsevier Patient Education  2020 [place]   1) The drugs that you were given will stay in your system until tomorrow so for the next 24 hours you should not:  A) Drive an automobile B) Make any legal decisions C) Drink any alcoholic beverage   2) You may resume regular meals tomorrow.  Today it is better to start with liquids and gradually work up to solid foods.  You may eat anything you prefer, but it is better to start with liquids, then soup and crackers, and gradually work up to solid foods.   3) Please notify your doctor immediately if you have any unusual bleeding, trouble breathing, redness and pain at the surgery site, drainage, fever, or pain not relieved by  medication.    4) Additional Instructions:        Please contact your physician with any problems or Same Day Surgery at [phone], Monday through Friday 6 am to 4 pm, or [place] at [facility] number at [phone].

## 2018-11-08 ENCOUNTER — Encounter: Payer: Self-pay | Admitting: General Surgery

## 2018-11-08 LAB — SURGICAL PATHOLOGY

## 2018-11-08 NOTE — Anesthesia Postprocedure Evaluation (Signed)
Anesthesia Post Note  Patient: Joseph Hodges  Procedure(s) Performed: APPENDECTOMY LAPAROSCOPIC (N/A Abdomen)  Patient location during evaluation: PACU Anesthesia Type: General Level of consciousness: awake and alert Pain management: pain level controlled Vital Signs Assessment: post-procedure vital signs reviewed and stable Respiratory status: spontaneous breathing, nonlabored ventilation and respiratory function stable Cardiovascular status: blood pressure returned to baseline and stable Postop Assessment: no apparent nausea or vomiting Anesthetic complications: no     Last Vitals:  Vitals:   11/07/18 1237 11/07/18 1320  BP: (!) 147/79 139/73  Pulse: 61 (!) 50  Resp: 16 14  Temp: (!) 36.1 C   SpO2: 97% 97%    Last Pain:  Vitals:   11/07/18 1320  TempSrc:   PainSc: 2                  Alphonsus Sias

## 2018-11-08 NOTE — Transfer of Care (Signed)
Immediate Anesthesia Transfer of Care Note  Patient: Joseph Hodges  Procedure(s) Performed: APPENDECTOMY LAPAROSCOPIC (N/A Abdomen)  Patient Location: PACU  Anesthesia Type:General  Level of Consciousness: sedated  Airway & Oxygen Therapy: Patient Spontanous Breathing and Patient connected to face mask oxygen  Post-op Assessment: Report given to RN and Post -op Vital signs reviewed and stable  Post vital signs: Reviewed and stable  Last Vitals:  Vitals Value Taken Time  BP 139/73 11/07/18 1320  Temp 36.1 C 11/07/18 1237  Pulse 50 11/07/18 1320  Resp 14 11/07/18 1320  SpO2 97 % 11/07/18 1320    Last Pain:  Vitals:   11/07/18 1320  TempSrc:   PainSc: 2          Complications: No apparent anesthesia complications

## 2018-11-12 ENCOUNTER — Telehealth: Payer: Self-pay | Admitting: General Surgery

## 2018-11-12 NOTE — Telephone Encounter (Signed)
Needs to refrain from lifting anything heavier than 10 lbs for 3 weeks, which would be the 11th.

## 2018-11-12 NOTE — Telephone Encounter (Signed)
Spoke to patient and notified him that he "needs to refrain from lifting anything heavier than 10 lbs for 3 weeks, which would be the 11th" per Dr.Cannon. Patient verbalizes understanding.

## 2018-11-12 NOTE — Telephone Encounter (Signed)
Patient is needing a doctors note to return to work in two weeks so he was thinking that would be on the forth and if he has any restrictions. Patient will come by and pick up the letter when he has a chance. Please call patient and advise.

## 2018-11-14 ENCOUNTER — Telehealth: Payer: Self-pay | Admitting: *Deleted

## 2018-11-14 NOTE — Telephone Encounter (Signed)
Spoke with patient. Return to work note with date 12/05/18 no restrictions provided.

## 2018-11-14 NOTE — Telephone Encounter (Signed)
Patient called and stated that the return back to note work is not what his job is looking for, he stated that it needs to state when he can return to work with no restrictions. Please call and advise

## 2018-11-29 ENCOUNTER — Ambulatory Visit (INDEPENDENT_AMBULATORY_CARE_PROVIDER_SITE_OTHER): Payer: No Typology Code available for payment source | Admitting: General Surgery

## 2018-11-29 ENCOUNTER — Other Ambulatory Visit: Payer: Self-pay

## 2018-11-29 ENCOUNTER — Encounter: Payer: Self-pay | Admitting: General Surgery

## 2018-11-29 VITALS — BP 143/88 | HR 91 | Temp 97.5°F | Resp 14 | Ht 73.0 in | Wt 214.8 lb

## 2018-11-29 DIAGNOSIS — Z9049 Acquired absence of other specified parts of digestive tract: Secondary | ICD-10-CM

## 2018-11-29 NOTE — Progress Notes (Signed)
[facility] visitor is here today for a postoperative visit.  He is a 63 year old man who had perforated appendicitis.  He was treated with interventional radiology placed drains and antibiotics.  He underwent an interval appendectomy on October 21.  He has done well since his operation.  He denies any nausea or vomiting.  No fevers or chills.  His appetite is normal and he has been moving his bowels regularly.  He does endorse some urinary urgency, but states that this is not constant, only occasionally and perhaps more frequent when he has been drinking a lot of coffee.  He denies any burning or pain with urination.  Today's Vitals   11/29/18 0917  BP: (!) 143/88  Pulse: 91  Resp: 14  Temp: (!) 97.5 F (36.4 C)  TempSrc: Temporal  SpO2: 96%  Weight: 214 lb 12.8 oz (97.4 kg)  Height: 6\' 1"  (1.854 m)  PainSc: 0-No pain   Body mass index is 28.34 kg/m. Focused abdominal examination: The laparoscopic port sites are healing well.  There is no erythema, induration, or drainage present.  Impression and plan: This is a 63 year old man who underwent an interval appendectomy for perforated appendicitis.  He is doing well since his operation.  I offered him a referral to urology to evaluate his urinary concerns, but he declined, stating he would like to wait a few more weeks.  He may contact either me or his primary care provider to arrange that referral, should he desire it.  He may resume all of his usual activities, including lifting anything heavier than 10 pounds.  He may return to work without restrictions.  We will see him on an as-needed basis.

## 2018-11-29 NOTE — Patient Instructions (Signed)
You may resume your normal activities. Return to work on 12/03/2018.

## 2018-12-04 ENCOUNTER — Encounter: Payer: Self-pay | Admitting: Interventional Radiology

## 2020-01-21 ENCOUNTER — Other Ambulatory Visit: Payer: No Typology Code available for payment source

## 2020-01-21 DIAGNOSIS — Z20822 Contact with and (suspected) exposure to covid-19: Secondary | ICD-10-CM

## 2020-01-22 LAB — NOVEL CORONAVIRUS, NAA: SARS-CoV-2, NAA: DETECTED — AB

## 2020-01-22 LAB — SPECIMEN STATUS REPORT

## 2020-01-22 LAB — SARS-COV-2, NAA 2 DAY TAT

## 2020-07-21 IMAGING — CT CT IMAGE GUIDED DRAINAGE BY PERCUTANEOUS CATHETER
1 of 5 series · 8 of 32 positions shown, 13 images · non-contrast
Comparison: none

INDICATION: History of perforated appendix with prior deep pelvic abscess and
prior drainage. Repeat CT on 09/05/2018 shows resolution of deep
pelvic abscess although a new multiloculated right lower quadrant
abscess is seen.
TECHNIQUE: Informed written consent was obtained from the patient after a
thorough discussion of the procedural risks, benefits and
alternatives. All questions were addressed. Maximal Sterile Barrier
Technique was utilized including caps, mask, sterile gowns, sterile
gloves, sterile drape, hand hygiene and skin antiseptic. A timeout
was performed prior to the initiation of the procedure.

[Series 3: i-spiral 5.0 b30f · axial · 0.74mm/px · z∈[-914,-742]mm · 8 of 63 slices shown, 13 images]
[im 7/63  soft-tissue]
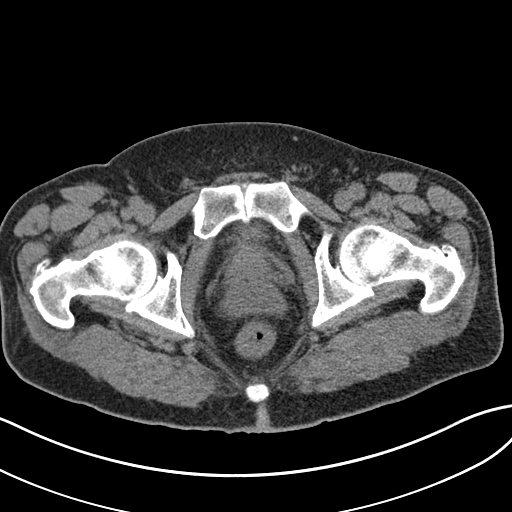
[im 7/63  bone]
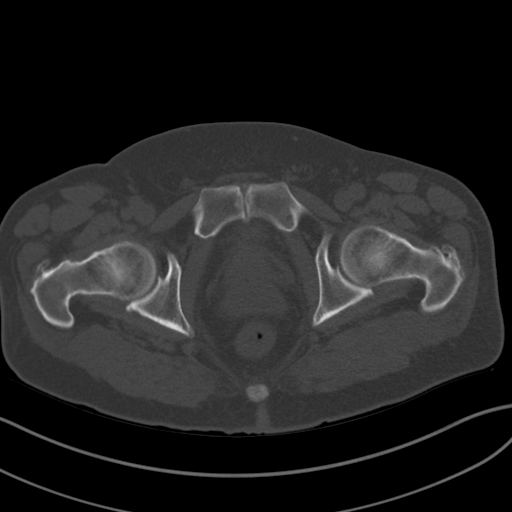
[im 14/63  soft-tissue]
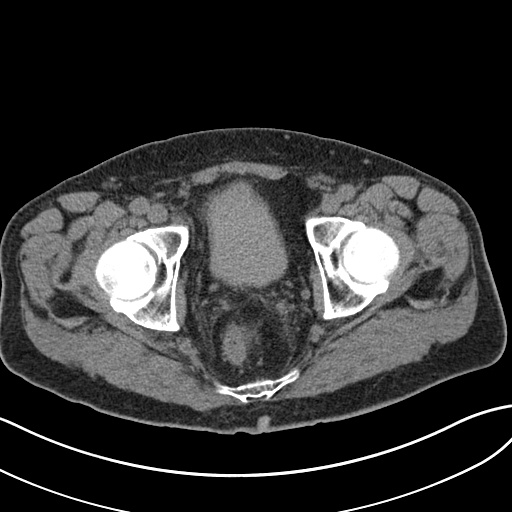
[im 21/63  soft-tissue]
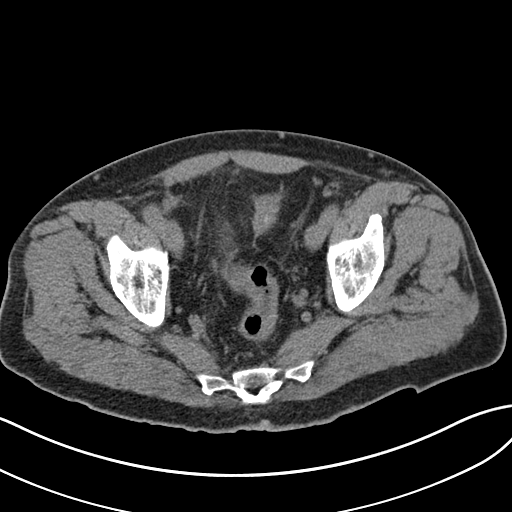
[im 28/63  soft-tissue]
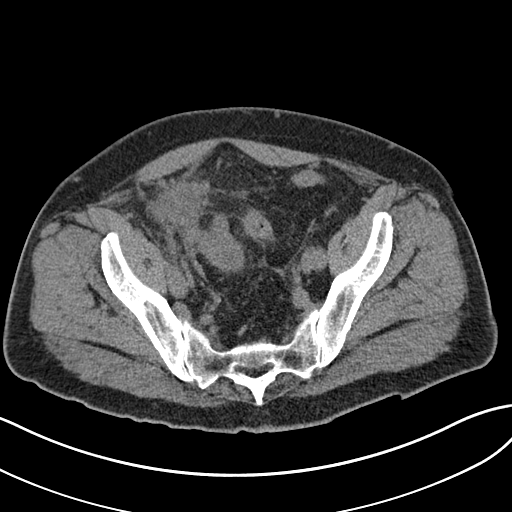
[im 35/63  soft-tissue]
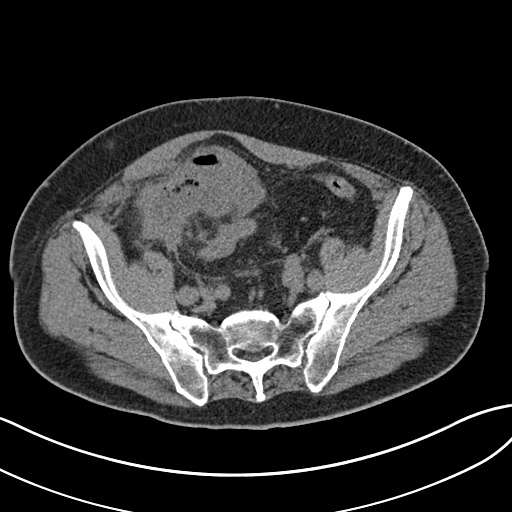
[im 35/63  lung]
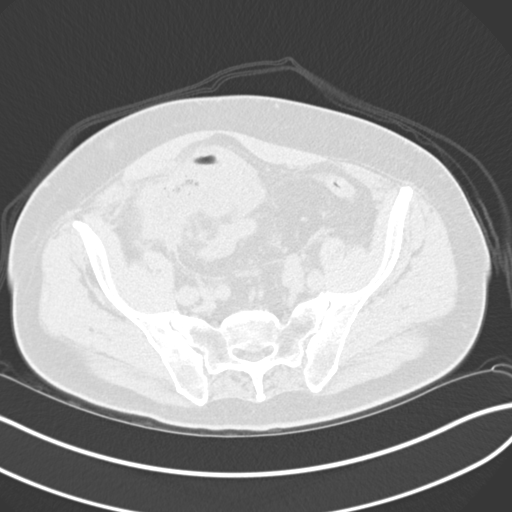
[im 42/63  soft-tissue]
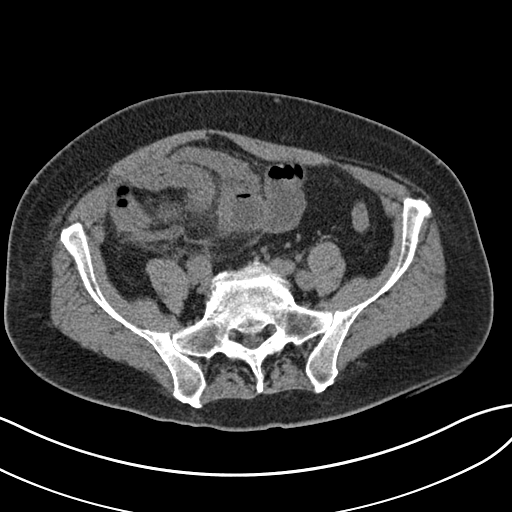
[im 42/63  lung]
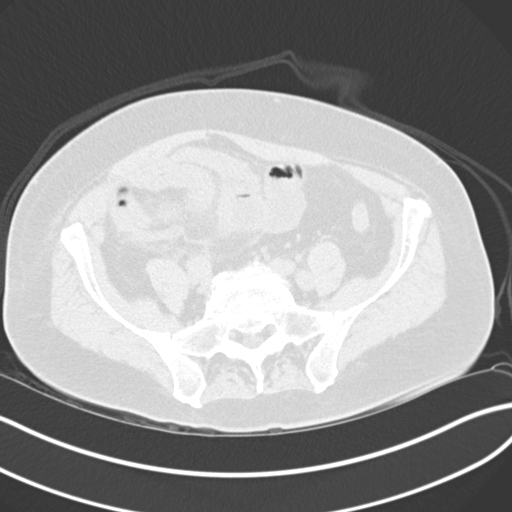
[im 49/63  soft-tissue]
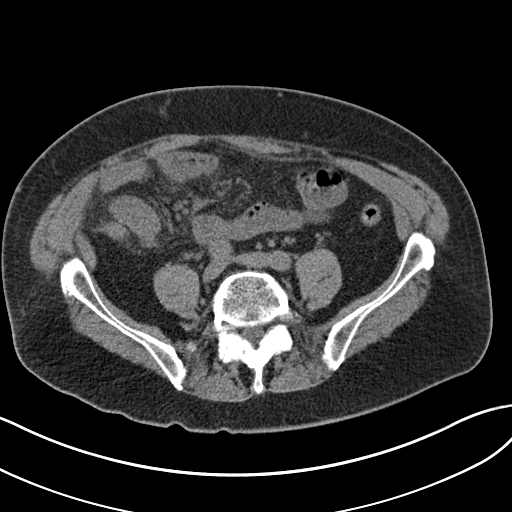
[im 49/63  lung]
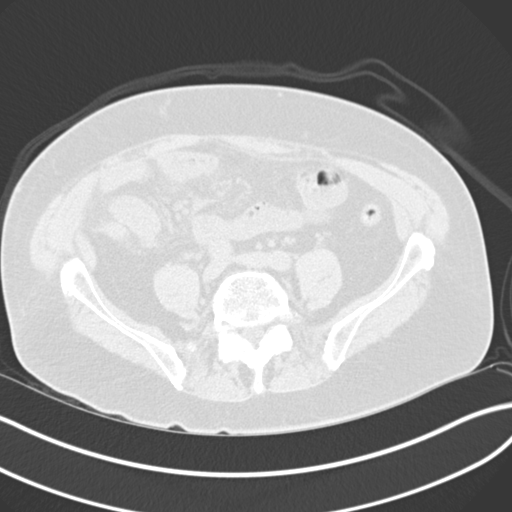
[im 56/63  soft-tissue]
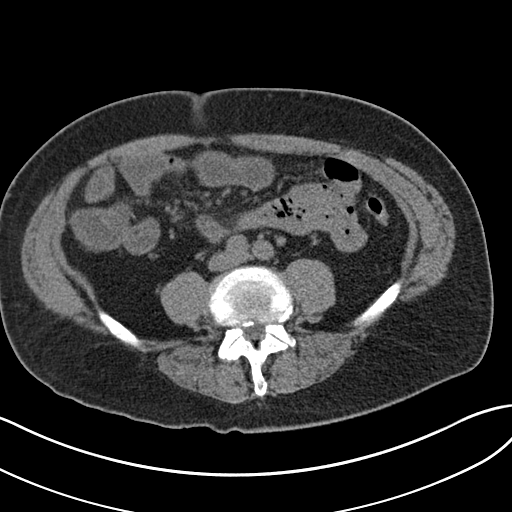
[im 56/63  lung]
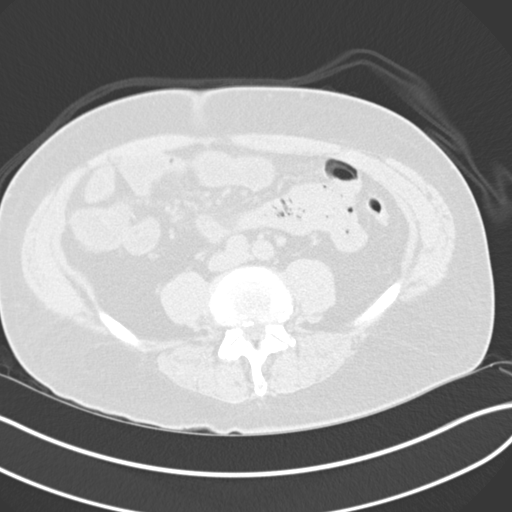

[8 of 32 positions shown; findings below may reference images not displayed]

EXAM:
CT GUIDED DRAINAGE OF RIGHT LOWER QUADRANT ABSCESS

MEDICATIONS:
The patient is currently admitted to the hospital and receiving
intravenous antibiotics. The antibiotics were administered within an
appropriate time frame prior to the initiation of the procedure.

ANESTHESIA/SEDATION:
1 mg IV Versed 75 mcg IV Fentanyl

Moderate Sedation Time:  15 minutes

The patient was continuously monitored during the procedure by the
interventional radiology nurse under my direct supervision.

COMPLICATIONS:
None immediate.
PROCEDURE:
The operative field was prepped with Chlorhexidine in a sterile
fashion, and a sterile drape was applied covering the operative
field. A sterile gown and sterile gloves were used for the
procedure. Local anesthesia was provided with 1% Lidocaine.

Utilizing CT fluoroscopic guidance and 0.25% Marcaine as a local and
deep anesthetic, a 10 French drainage catheter was placed
percutaneously into the right lower quadrant abscess. 35 cc of
purulent material was withdrawn and sent for laboratory evaluation.
Catheter was then sutured into place utilizing 0 Prolene and Lopez
Minhazur Rahman Thisa. Catheter was then placed to an external suction
grenade. Catheter was then dressed in the standard sterile manner.
Patient tolerated the procedure well and discharged home in
satisfactory condition.
IMPRESSION: Successful placement of a 10 French drainage catheter into a right
lower quadrant abscess with near complete decompression of the
abscess.

## 2020-11-02 ENCOUNTER — Encounter: Payer: Self-pay | Admitting: General Surgery

## 2023-12-13 ENCOUNTER — Other Ambulatory Visit: Payer: Self-pay | Admitting: Orthopedic Surgery

## 2023-12-18 MED ORDER — CEFAZOLIN SODIUM-DEXTROSE 2-4 GM/100ML-% IV SOLN
2.0000 g | INTRAVENOUS | Status: AC
  Administered 2023-12-19: 2 g via INTRAVENOUS

## 2023-12-18 NOTE — Progress Notes (Signed)
 ORTHOPEDIC SURGERY - KNEE EVALUATION  Chief Complaint: No chief complaint on file.   History of Present Illness: 12/18/23: Joseph Hodges is a 68 y.o. male  referred by [facility] for R knee evaluation and management.  Prior medical records were reviewed.  He was initially evaluated on 12/11/2023 By Joseph Brain, PA at the urgent care.  He had an injury earlier that morning where he slipped on dry ice on his back porch and twisted his knee.  He noted significant swelling and difficulty with ambulation and inability to lift his leg up.  Clinically, there was concern for quadriceps tendon rupture.  He is given a hinged knee brace and has been ambulating with it locked in extension.  He works in production designer, theatre/television/film.  He can perform much of his job in a seated position as the house work on sewing machines.  He does have to sometimes go from sit to stand and/or climb small ladders.  He lives at home with his wife.  He has never smoked.  PMHx, PSurgHx, Fam Hx, Soc Hx, Meds, Allergies: No past medical history on file.  Past Surgical History:  Procedure Laterality Date  . APPENDECTOMY  2021    No family history on file.  Social History   Socioeconomic History  . Marital status: Unknown  Tobacco Use  . Smoking status: Never  . Smokeless tobacco: Never  Vaping Use  . Vaping status: Never Used  Substance and Sexual Activity  . Alcohol use: Not Currently  . Drug use: Never  . Sexual activity: Defer   Social Drivers of Health   Financial Resource Strain: Low Risk  (12/11/2023)   Overall Financial Resource Strain (CARDIA)   . Difficulty of Paying Living Expenses: Not hard at all  Food Insecurity: No Food Insecurity (12/11/2023)   Hunger Vital Sign   . Worried About Programme Researcher, Broadcasting/film/video in the Last Year: Never true   . Ran Out of Food in the Last Year: Never true  Transportation Needs: No Transportation Needs (12/11/2023)   PRAPARE - Transportation   . Lack of Transportation (Medical): No   . Lack  of Transportation (Non-Medical): No  Housing Stability: Low Risk  (12/11/2023)   Housing Stability Vital Sign   . Unable to Pay for Housing in the Last Year: No   . Number of Times Moved in the Last Year: 0   . Homeless in the Last Year: No     Current Outpatient Medications  Medication Sig Dispense Refill  . calcium carbonate (TUMS) 200 mg calcium (500 mg) chewable tablet Take 1 tablet by mouth    . FA-B cmp,C-rice bran-rose hips (B-COMPLEX WITH VITAMIN C) 400-500 mcg-mg Tab Take 1 tablet by mouth once daily    . potassium gluconate 2.5 mEq Tab Take 99 mg by mouth once daily    . potassium, magnesium aspartate (MAGNESIUM, POTASSIUM ASPARTATE ORAL) Take 1 tablet by mouth once daily     No current facility-administered medications for this visit.    No Known Allergies  Review of Systems: A 10+ ROS was performed, reviewed, and the pertinent orthopaedic findings are documented in the HPI.  I have reviewed and agree with the ROS captured by the CMA.    Physical Exam: There were no vitals filed for this visit. General/Constitutional: NAD, conversant Eyes: Pupils equal and round, extraocular movements intact ENT: atraumatic external nose and ears, moist mucous membranes Respiratory: non-labored breathing, symmetric chest rise Cardiovascular: no visible lower extremity edema, peripheral pulses present  Skin:  normal skin turgor, warm and dry Neurological: cranial nerves grossly intact, sensation grossly intact Psychological:  Appropriate mood and affect; appropriate judgment Musculoskeletal: as detailed below:  Comprehensive Knee Exam:   Right Left  Range of Motion Unable to perform straight leg raise Able to perform straight leg raise without lag  Tenderness About superior patella, palpable defect superior to the patella   Skin Normal Normal  Gait    Alignment Normal Normal  Quad Atrophy none none  Crepitus    Effusion 2+     Ligamentous Exam   Right Left  Lachman Normal    Pivot Shift    Varus 0 Normal   Varus 30 Normal   Valgus 0 Normal   Valgus 30 Normal   Anterior Drawer    Posterior Drawer Normal   Posterior Sag No   Quad Active Test    Dial @ 30    Dial @ 90    Posterolateral Drawer    ER Recurvatum      Meniscal Exam   Right Left  Hyperflexion Test    Hyperextension Test    McMurray's    Steinman's    Apley Grind       Patellofemoral Exam   Right Left  Patellar Grind    Moving Patellar Apprehension    J-Sign    Excessive Tilt    Lateral Patellar Mobility    Medial Patellar Mobility      Neurovascular   Right Left  Quadriceps Strength    Hip Abductor Strength    Distal Motor Normal Normal  Distal Sensory Normal Normal  Distal Pulses Normal Normal   Imaging:  R Knee radiographs:  12/18/2023: 1 view of the right knee (lateral) were obtained. There are no fractures or dislocations.  There are no notable degenerative changes on the 1 view of the lateral knee.  There does appear to be patella Baha with joint effusion.  There also appears to be an ossific density superior to the patella, possibly avulsion of the superior patella with quadriceps tendon rupture.   R Knee MRI:  I personally reviewed and visualized the aforementioned imaging studies. I additionally personally interpreted any radiographs taken during today's visit.   Assessment & Plan: There are no diagnoses linked to this encounter.   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 1.  We discussed the diagnosis as well as treatment options including both surgical and nonsurgical management.  Given the nature of the injury, after discussion of risks, benefits, and alternatives to surgery, the patient elected to proceed with surgical intervention.  This would consist of right quadriceps tendon repair tomorrow, 12/19/2023.  2.  Patient already had hinged knee brace dispensed.  3.  Follow-up with me approximately 2 weeks after surgery.  Plan  for outpatient physical therapy.

## 2023-12-19 ENCOUNTER — Encounter: Payer: Self-pay | Admitting: Orthopedic Surgery

## 2023-12-19 ENCOUNTER — Ambulatory Visit: Payer: Self-pay | Admitting: Certified Registered"

## 2023-12-19 ENCOUNTER — Other Ambulatory Visit: Payer: Self-pay

## 2023-12-19 ENCOUNTER — Encounter: Admission: RE | Disposition: A | Payer: Self-pay | Source: Home / Self Care | Attending: Orthopedic Surgery

## 2023-12-19 ENCOUNTER — Inpatient Hospital Stay: Admission: RE | Admit: 2023-12-19 | Source: Ambulatory Visit

## 2023-12-19 ENCOUNTER — Ambulatory Visit
Admission: RE | Admit: 2023-12-19 | Discharge: 2023-12-19 | Disposition: A | Payer: Self-pay | Attending: Orthopedic Surgery | Admitting: Orthopedic Surgery

## 2023-12-19 DIAGNOSIS — Z419 Encounter for procedure for purposes other than remedying health state, unspecified: Secondary | ICD-10-CM

## 2023-12-19 HISTORY — PX: QUADRICEPS TENDON REPAIR: SHX756

## 2023-12-19 SURGERY — REPAIR, TENDON, QUADRICEPS
Anesthesia: General | Site: Knee | Laterality: Right

## 2023-12-19 MED ORDER — FENTANYL CITRATE (PF) 100 MCG/2ML IJ SOLN: 25.0000 ug | INTRAMUSCULAR | Status: DC | PRN

## 2023-12-19 MED ORDER — EPHEDRINE SULFATE-NACL 50-0.9 MG/10ML-% IV SOSY
PREFILLED_SYRINGE | INTRAVENOUS | Status: DC | PRN
  Administered 2023-12-19: 10 mg via INTRAVENOUS

## 2023-12-19 MED ORDER — ACETAMINOPHEN 10 MG/ML IV SOLN
INTRAVENOUS | Status: AC
  Filled 2023-12-19: qty 100

## 2023-12-19 MED ORDER — FENTANYL CITRATE (PF) 100 MCG/2ML IJ SOLN
INTRAMUSCULAR | Status: AC
  Filled 2023-12-19: qty 2

## 2023-12-19 MED ORDER — DEXMEDETOMIDINE HCL IN NACL 80 MCG/20ML IV SOLN
INTRAVENOUS | Status: DC | PRN
  Administered 2023-12-19: 12 ug via INTRAVENOUS
  Administered 2023-12-19: 8 ug via INTRAVENOUS

## 2023-12-19 MED ORDER — MIDAZOLAM HCL 5 MG/5ML IJ SOLN
INTRAMUSCULAR | Status: DC | PRN
  Administered 2023-12-19: 2 mg via INTRAVENOUS

## 2023-12-19 MED ORDER — DEXAMETHASONE SOD PHOSPHATE PF 10 MG/ML IJ SOLN
INTRAMUSCULAR | Status: DC | PRN
  Administered 2023-12-19: 10 mg via INTRAVENOUS

## 2023-12-19 MED ORDER — MIDAZOLAM HCL 2 MG/2ML IJ SOLN
INTRAMUSCULAR | Status: AC
  Filled 2023-12-19: qty 2

## 2023-12-19 MED ORDER — 0.9 % SODIUM CHLORIDE (POUR BTL) OPTIME
TOPICAL | Status: DC | PRN
  Administered 2023-12-19: 500 mL

## 2023-12-19 MED ORDER — OXYCODONE HCL 5 MG PO TABS
ORAL_TABLET | ORAL | Status: AC
  Filled 2023-12-19: qty 2

## 2023-12-19 MED ORDER — CEFAZOLIN SODIUM-DEXTROSE 2-4 GM/100ML-% IV SOLN
INTRAVENOUS | Status: AC
  Filled 2023-12-19: qty 100

## 2023-12-19 MED ORDER — CHLORHEXIDINE GLUCONATE 0.12 % MT SOLN
OROMUCOSAL | Status: AC
  Filled 2023-12-19: qty 15

## 2023-12-19 MED ORDER — CHLORHEXIDINE GLUCONATE 0.12 % MT SOLN
15.0000 mL | Freq: Once | OROMUCOSAL | Status: AC
  Administered 2023-12-19: 15 mL via OROMUCOSAL

## 2023-12-19 MED ORDER — LACTATED RINGERS IV SOLN: INTRAVENOUS | Status: DC

## 2023-12-19 MED ORDER — ACETAMINOPHEN 10 MG/ML IV SOLN
INTRAVENOUS | Status: DC | PRN
  Administered 2023-12-19: 1000 mg via INTRAVENOUS

## 2023-12-19 MED ORDER — ACETAMINOPHEN 500 MG PO TABS: 1000.0000 mg | ORAL_TABLET | Freq: Three times a day (TID) | ORAL | 2 refills | Status: AC

## 2023-12-19 MED ORDER — HYDROCODONE-ACETAMINOPHEN 5-325 MG PO TABS: 1.0000 | ORAL_TABLET | ORAL | 0 refills | Status: AC | PRN

## 2023-12-19 MED ORDER — LIDOCAINE HCL (CARDIAC) PF 100 MG/5ML IV SOSY
PREFILLED_SYRINGE | INTRAVENOUS | Status: DC | PRN
  Administered 2023-12-19: 50 mg via INTRAVENOUS

## 2023-12-19 MED ORDER — BUPIVACAINE HCL (PF) 0.5 % IJ SOLN
INTRAMUSCULAR | Status: AC
  Filled 2023-12-19: qty 30

## 2023-12-19 MED ORDER — PROPOFOL 10 MG/ML IV BOLUS
INTRAVENOUS | Status: AC
  Filled 2023-12-19: qty 20

## 2023-12-19 MED ORDER — ONDANSETRON 4 MG PO TBDP: 4.0000 mg | ORAL_TABLET | Freq: Three times a day (TID) | ORAL | 0 refills | Status: AC | PRN

## 2023-12-19 MED ORDER — KETAMINE HCL 50 MG/5ML IJ SOSY
PREFILLED_SYRINGE | INTRAMUSCULAR | Status: DC | PRN
  Administered 2023-12-19: 30 mg via INTRAVENOUS

## 2023-12-19 MED ORDER — KETAMINE HCL 50 MG/5ML IJ SOSY
PREFILLED_SYRINGE | INTRAMUSCULAR | Status: AC
  Filled 2023-12-19: qty 5

## 2023-12-19 MED ORDER — OXYCODONE HCL 5 MG PO TABS
10.0000 mg | ORAL_TABLET | Freq: Once | ORAL | Status: AC
  Administered 2023-12-19: 10 mg via ORAL

## 2023-12-19 MED ORDER — BUPIVACAINE LIPOSOME 1.3 % IJ SUSP
INTRAMUSCULAR | Status: DC | PRN
  Administered 2023-12-19: 14 mL via INTRAMUSCULAR

## 2023-12-19 MED ORDER — ASPIRIN 325 MG PO TBEC: 325.0000 mg | DELAYED_RELEASE_TABLET | Freq: Every day | ORAL | 0 refills | Status: AC

## 2023-12-19 MED ORDER — PROPOFOL 10 MG/ML IV BOLUS
INTRAVENOUS | Status: DC | PRN
  Administered 2023-12-19: 200 mg via INTRAVENOUS
  Administered 2023-12-19: 50 mg via INTRAVENOUS

## 2023-12-19 MED ORDER — BUPIVACAINE LIPOSOME 1.3 % IJ SUSP
INTRAMUSCULAR | Status: AC
  Filled 2023-12-19: qty 20

## 2023-12-19 MED ORDER — FENTANYL CITRATE (PF) 100 MCG/2ML IJ SOLN
INTRAMUSCULAR | Status: DC | PRN
  Administered 2023-12-19: 50 ug via INTRAVENOUS

## 2023-12-19 MED ORDER — DROPERIDOL 2.5 MG/ML IJ SOLN: 0.6250 mg | Freq: Once | INTRAMUSCULAR | Status: DC | PRN

## 2023-12-19 MED ORDER — GLYCOPYRROLATE 0.2 MG/ML IJ SOLN
INTRAMUSCULAR | Status: DC | PRN
  Administered 2023-12-19: .2 mg via INTRAVENOUS

## 2023-12-19 MED ORDER — ONDANSETRON HCL 4 MG/2ML IJ SOLN
INTRAMUSCULAR | Status: DC | PRN
  Administered 2023-12-19: 4 mg via INTRAVENOUS

## 2023-12-19 MED ORDER — ORAL CARE MOUTH RINSE: 15.0000 mL | Freq: Once | OROMUCOSAL | Status: AC

## 2023-12-19 SURGICAL SUPPLY — 38 items
BLADE SURG SZ10 CARB STEEL (BLADE) ×1 IMPLANT
BNDG COMPR 6X5.8 VLCR NS LF (GAUZE/BANDAGES/DRESSINGS) ×2 IMPLANT
BNDG ESMARCH 6X12 STRL LF (GAUZE/BANDAGES/DRESSINGS) ×1 IMPLANT
CHLORAPREP W/TINT 26 (MISCELLANEOUS) ×1 IMPLANT
COOLER ICEMAN CLASSIC (MISCELLANEOUS) ×1 IMPLANT
CUFF TRNQT CYL 24X4X16.5-23 (TOURNIQUET CUFF) IMPLANT
CUFF TRNQT CYL 30X4X21-28X (TOURNIQUET CUFF) IMPLANT
DERMABOND ADVANCED .7 DNX12 (GAUZE/BANDAGES/DRESSINGS) ×1 IMPLANT
DRAPE SHEET LG 3/4 BI-LAMINATE (DRAPES) ×1 IMPLANT
ELECT CAUTERY BLADE 6.4 (BLADE) ×1 IMPLANT
ELECTRODE REM PT RTRN 9FT ADLT (ELECTROSURGICAL) ×1 IMPLANT
GLOVE BIOGEL PI IND STRL 8 (GLOVE) ×2 IMPLANT
GLOVE SURG SYN 7.5 PF PI (GLOVE) ×2 IMPLANT
GOWN SRG LRG LVL 4 IMPRV REINF (GOWNS) ×1 IMPLANT
GOWN SRG XL LONG LVL 3 NONREIN (GOWNS) ×1 IMPLANT
HANDLE YANKAUER SUCT OPEN TIP (MISCELLANEOUS) ×1 IMPLANT
KIT TURNOVER KIT A (KITS) ×1 IMPLANT
MANIFOLD NEPTUNE II (INSTRUMENTS) ×1 IMPLANT
NDL MAYO CATGUT SZ1 (NEEDLE) IMPLANT
NDL REVERSE CUT 1/2 CRC (NEEDLE) IMPLANT
NDL SUT 5 .5 CRC TPR PNT MAYO (NEEDLE) IMPLANT
NS IRRIG 500ML POUR BTL (IV SOLUTION) ×1 IMPLANT
PACK EXTREMITY ARMC (MISCELLANEOUS) ×1 IMPLANT
PAD ABD DERMACEA PRESS 5X9 (GAUZE/BANDAGES/DRESSINGS) ×1 IMPLANT
PAD CAST 4YDX4 CTTN HI CHSV (CAST SUPPLIES) ×1 IMPLANT
PAD COLD UNI WRAP-ON (PAD) ×1 IMPLANT
PENCIL SMOKE EVACUATOR (MISCELLANEOUS) ×1 IMPLANT
RETRIEVER SUT HEWSON (MISCELLANEOUS) IMPLANT
SOLN STERILE WATER 500 ML (IV SOLUTION) ×1 IMPLANT
SPONGE T-LAP 18X18 ~~LOC~~+RFID (SPONGE) ×1 IMPLANT
STAPLER SKIN PROX 35W (STAPLE) IMPLANT
STOCKINETTE IMPERVIOUS 9X36 MD (GAUZE/BANDAGES/DRESSINGS) ×1 IMPLANT
SUT VIC AB 0 CT1 36 (SUTURE) ×1 IMPLANT
SUT VIC AB 2-0 CT1 TAPERPNT 27 (SUTURE) ×1 IMPLANT
SUTURE MNCRL 4-0 27XMF (SUTURE) ×1 IMPLANT
SUTURE TAPE 1.3 40 TPR END (SUTURE) IMPLANT
SYSTEM INTERNAL BRACE KNEE (Miscellaneous) IMPLANT
TRAP FLUID SMOKE EVACUATOR (MISCELLANEOUS) ×1 IMPLANT

## 2023-12-19 NOTE — Op Note (Addendum)
 DATE OF SURGERY: 12/19/2023  PRE-OP DIAGNOSIS: Right Quadricpes Tendon Rupture   POST-OP DIAGNOSIS: Right Quadriceps Tendon Rupture  PROCEDURES: Right Quadriceps Tendon Repair  SURGEON: Earnestine HILARIO Blanch, MD  ASSISTANT: DOROTHA Krystal Doyne, PA   ANESTHESIA: Gen  TOTAL IV FLUIDS: see anesthesia record  ESTIMATED BLOOD LOSS: 5cc  TOURNIQUET TIME: 47 min  DRAINS:  none  SPECIMENS: None.  IMPLANTS: Arthrex 4.62mm SwiveLock anchors x 2  COMPLICATIONS: None apparent.  INDICATIONS: Joseph Hodges is a 68 y.o. male who sustained a quadriceps tendon rupture after a fall. Physical exam was notable for an obvious gap in the quadriceps tendon just superior to the patella.  The patient was unable to perform a straight leg raise. After discussion of risks, benefits, and alternatives to surgery, the patient elected to proceed with quadriceps tendon repair.  DETAILS OF PROCEDURE: Development Worker, Community was met in the preoperative holding area and informed consent was verified.  The patient was brought to the operating room and placed supine on the table. Anesthesia was administered. Leg was prescrubbed with Hibiclens  and alcohol, prepped with ChloraPrep and draped in the usual sterile fashion. The patient was given preoperative IV antibiotics within 30 minutes of the start of the case, and a surgical time-out occurred. A well-padded tourniquet was placed.   The leg was elevated, exsanguinated with an Esmarch bandage and tourniquet inflated to . A midline incision was created on the knee from the superior pole of the patella to approximately 8 cm superiorly. The retinacular layer was developed, medial and lateral, in line with the skin incision. At that point, an obvious tear of the quadriceps tendon was notable. Medial and lateral retinacular tears were also identified. Edges of the quadriceps tendon were debrided to healthy tendon. The superior patella was prepared by removing soft tissue proximally and any  small bony fragments. I then created a small trough with a rongeur. Two SutureTape sutures were then placed in the proximal quadriceps tendon with medial Krackow stitch and a lateral Krackow stitch. A pin was placed in the superior patella at the medial 1/3 junction of the patella. Similarly, another pin was passed at the lateral 1/3 junction of the patella. Both pins were overdrilled, and then tapped twice in preparation of insertion of a 4.20mm SwiveLock anchor. The wound was thoroughly irrigated at this point. The medial SutureTape sutures were loaded into a 4.61mm SwiveLock anchor and the anchor was inserted with the appropriate amount of tension with the leg in full extension. This process was repeated for the lateral sutures. This construct appropriately reduced the quadriceps tendon to the superior pole of the patella. The FiberWire sutures from each anchor were used to oversew the quadriceps tendon to reinforce the repair. The medial and lateral retinacular layers were closed with 0 Vicryl suture in a figure of 8 fashion. The wound was irrigated again.  The patient could reach ~30 degrees of flexion before there was a significant increase in tension.  There was no gap formation between the superior patella and quadriceps tendon. Local anesthetic was injected. 2-0 Vicryl was used to close the subdermal layer tissue. Staples were used to close skin.  Tourniquet was released. Sterile dressing, PolarCare, and hinged knee brace locked at 0 degrees were applied. Instrument, sponge, and needle counts were correct prior to wound closure and at the conclusion of the case. The patient was then awakened from anesthesia without complication.  Of note, assistance from a PA was essential to performing the surgery.  PA was present  for the entire surgery.  PA assisted with patient positioning, retraction, instrumentation, and wound closure. The surgery would have been more difficult and had longer operative time without  PA assistance.     POST-OPERATIVE PLAN: - ASA 325mg /day x for DVT ppx - WBAT on operative lower extremity with brace locked in extension x 6 weeks - PT/OT to start on POD #3-4 - Follow-up with me in approximately 2 weeks   REHAB PROTOCOL    GOALS:  ?A/AAROM 90-100 degrees by 6 weeks, 0-110 degrees by week 8, 0-130 degreesby week 10, and 0-135 degrees by week 12.  Week 1-4  No active ROM knee extension.  ?PROM knee ext to 0 degrees ?AROM/AAROM knee flexion - very gently - Safe range as determined in Operative note (amount of tension-free repair). 30 degrees ?Gradually unlock brace for sitting as PROM knee flexion improves  Exercises:  ?Ankle pumps ?Patellar mobilizations ?Hamstring stretch sitting ?Gastroc stretch with towel ?Heelslides ?Quad sets - may add E-stim for re-education at 2-3 weeks upon MD approval ?Patellar mobilization - all directions. ?SLR all directions, active assistive flexion- start at 3rd post-op week - do notallow lag - use e-stim as needed after 2-3 weeks. If unable to achieve fullextension, perform SLR in knee immobilizer  Week 5: Gradually increase A/AAROM knee flexion  Exercises:  ?Submaximal multi-angle isometrics (30-50% only) ?Continue knee flexion ROM - rocking chair at home ?Active SLR 4 way - no weight for flexion - watch for extensor lag - increaseresistance for hip abduction, adduction, and extension.  Add aquatic therapy if available. Move slowly so water is assistive and not resistive  Aquatic therapy exercises:  ?With knee submerged in water, knee dangling at 80-90 degrees - slowly activelyextend knee to 0 degrees. ?Water walking in chest deep water ?SLR 4 way in the water with knee straight ?Knee flexion in water  Week 6-8:  Brace - unlock for sitting to 90 degrees at 6 weeks. If quad control sufficient at 8 weeks unlock brace 0-90 degrees for ambulation with bilateral axillary crutches and gradually open brace as ROM improves.  Progress to ambulation at 8 weeks with no crutches as quadriceps strength allows. D/C crutches and brace at 8-12 weeks depending on patient's quadriceps control. Emphasize frequent ROM exercises  Goals - Gradually increase P/A/AAROM during weeks 6-8   Exercises:  ?Total gym semi squats level 3-4 ?Gradually increase weight on all SLR, if no lag present ?Week 6 - bike (begin with rocking and progress to full revolutions) ?Week 6 - Closed chain terminal knee extension with theraband ?Week 6 - SAQ (AROM) ?Week 7 - LAQ (AROM) ?Week 8 - SAQ (gradually increase resistance) ?Week 8 - LAQ (gradually increase resistance) ?Week 8 - weight shifts ?Week 8 - balance master and/or BAPS - with bilateral LE weight bearing ?Week 8 - cones  Week 9-10:  Exercises:  ?Total gym level 5-6 ?Bilateral leg press - concentric only - no significant load work until 12 weeks.  ?Weight shift on minitramp ?Toe rises ?Treadmill - Concentrate on pattern with eccentric knee control Week 11-16:  Exercises:  ?Leg press - Gradually increase weight and begin unilateral leg press at week 12 ?Wall squats ?Balance activities: unilateral stance eyes open and closed, balance master ?Standing minisquats ?Step-ups - start concentrically, 2" to start and progress as tolerated ?Week 16 - lunges ?Week 16 - stairclimber/elliptical machine  CRITERIA TO START RUNNING PROGRAM  ?Patient is able to walk with a normal gait pattern for at least 20 minutes  withoutsymptoms and performs ADL's painfree ?ROM is equal to uninvolved side, or at least 0-125 degrees ?Hamstring and quadriceps strength is 70% of the uninvolved side isokinetically ?Patient without pain, edema, crepitus, or giving-way   ?Wall squats ?Balance activities: unilateral stance eyes open and closed, balance master ?Standing minisquats ?Step-ups - start concentrically, 2" to start and progress as tolerated ?Week 16 - lunges ?Week 16 - stairclimber/elliptical  machine  CRITERIA TO START RUNNING PROGRAM  ?Patient is able to walk with a normal gait pattern for at least 20 minutes withoutsymptoms and performs ADL's painfree ?ROM is equal to uninvolved side, or at least 0-125 degrees ?Hamstring and quadriceps strength is 70% of the uninvolved side isokinetically ?Patient without pain, edema, crepitus, or giving-way

## 2023-12-19 NOTE — Anesthesia Procedure Notes (Signed)
 Procedure Name: LMA Insertion Date/Time: 12/19/2023 1:12 PM  Performed by: Germaine Maeola CROME, CRNAPre-anesthesia Checklist: Patient identified, Emergency Drugs available, Suction available and Patient being monitored Patient Re-evaluated:Patient Re-evaluated prior to induction Oxygen Delivery Method: Circle system utilized Preoxygenation: Pre-oxygenation with 100% oxygen Induction Type: IV induction Ventilation: Mask ventilation without difficulty LMA: LMA inserted LMA Size: 5.0 Tube type: Oral Number of attempts: 1 Placement Confirmation: positive ETCO2 and breath sounds checked- equal and bilateral Tube secured with: Tape Dental Injury: Teeth and Oropharynx as per pre-operative assessment

## 2023-12-19 NOTE — H&P (Signed)
 Paper H&P to be scanned into permanent record. H&P reviewed. No significant changes noted.

## 2023-12-19 NOTE — Discharge Instructions (Signed)
 Post-Op Instructions - Quadriceps Tendon Repair  1. Bracing or crutches: You will be provided with a long brace (from hip to ankle) and crutches.  2. Ice: You will be provided with a device [facility]) that allows you to ice the affected area effectively.   3. Showering: Incision must remain dry for 5 days. Afterwards, you may shower and gently pat incision dry. NO submerging wound for 4 weeks.   4. Driving: You will be given specific driving precautions at discharge. Plan on not driving for at least one week for left knee surgery, and 4-6 weeks for right knee surgery if you are restricted due to the brace and knee motion. Please note that you are advised NOT to drive while taking narcotic pain medications as you may be impaired and unsafe to drive.  5. Activity: Weight bearing: Weight bearing as tolerated with brace locked in extension (brace MUST remain locked while ambulating). Bending the knee is limited and will be guided by the physical therapist. Elevate knee above heart level as much as possible for one week. Avoid standing more than 5 minutes (consecutively) for the first week. No exercise involving the knee until cleared by the surgeon or physical therapist.  Avoid long distance travel for 4 weeks.  6. Medications: - You have been provided a prescription for narcotic pain medicine. After surgery, take 1-2 narcotic tablets every 4 hours if needed for severe pain.  - A prescription for anti-nausea medication will be provided in case the narcotic medicine causes nausea - take 1 tablet every 6 hours only if nauseated.  - Take aspirin 325mg  daily for 4 weeks to prevent blood clots.  -Take tylenol  1000 every 8 hours for pain.  May stop tylenol  3 days after surgery or when you are having minimal pain. -DO NOT TAKE IBUPROFEN , ALEVE or OTHER NSAIDs as they may interfere with healing.    If you are taking prescription medication for anxiety, depression, insomnia, muscle spasm, chronic pain, or  for attention deficit disorder, you are advised that you are at a higher risk of adverse effects with use of narcotics post-op, including narcotic addiction/dependence, depressed breathing, death. If you use non-prescribed substances: alcohol, marijuana, cocaine, heroin, methamphetamines, etc., you are at a higher risk of adverse effects with use of narcotics post-op, including narcotic addiction/dependence, depressed breathing, death. You are advised that taking > 50 morphine milligram equivalents (MME) of narcotic pain medication per day results in twice the risk of overdose or death. For your prescription provided: oxycodone 5 mg - taking more than 6 tablets per day would result in > 50 morphine milligram equivalents (MME) of narcotic pain medication. Be advised that we will prescribe narcotics short-term, for acute post-operative pain, only 3 weeks for major operations such as knee repair/reconstruction surgeries.   7. Bandages: The physical therapist should change the bandages at the first post-op appointment. If needed, the dressing supplies have been provided to you.  8. Physical Therapy: 1-2 times per week starting within ~1 week of surgery. You have been provided an order for physical therapy today and should have your first appointment scheduled. The therapist will provide home exercises.  9. Work or School: For most, but not all procedures, we advise staying out of work or school for at least 1 to 2 weeks in order to recover from the stress of surgery and to allow time for healing and swelling control. More labor intensive jobs may require additional time off. If you need a work or school  note this can be provided.   10. Post-Op Appointments: Your first post-op appointment will be in approximately 2 weeks time. Please double check if this will be at the [facility] facility (Tuesdays and Thursdays) or [place] facility (Wednesdays).    If you find that they have not been scheduled please call  the Orthopaedic Appointment front desk at [phone].

## 2023-12-19 NOTE — Progress Notes (Signed)
 Patient drank coffee with creamer at 0430 day of surgery. Dr. Stevan, anesthesia notified, states we will have to confirm with Anesthesia that comes on at 0700.  Dr. Tobie notified.

## 2023-12-19 NOTE — Anesthesia Preprocedure Evaluation (Signed)
 Anesthesia Evaluation  Patient identified by MRN, date of birth, ID band Patient awake    Reviewed: Allergy & Precautions, H&P , NPO status , Patient's Chart, lab work & pertinent test results, reviewed documented beta blocker date and time   History of Anesthesia Complications Negative for: history of anesthetic complications  Airway Mallampati: III  TM Distance: >3 FB Neck ROM: full    Dental  (+) Caps, Dental Advidsory Given, Teeth Intact, Missing   Pulmonary neg pulmonary ROS   Pulmonary exam normal breath sounds clear to auscultation       Cardiovascular Exercise Tolerance: Good negative cardio ROS Normal cardiovascular exam Rhythm:regular Rate:Normal     Neuro/Psych negative neurological ROS  negative psych ROS   GI/Hepatic Neg liver ROS,GERD  ,,  Endo/Other  negative endocrine ROS    Renal/GU negative Renal ROS  negative genitourinary   Musculoskeletal   Abdominal   Peds  Hematology negative hematology ROS (+)   Anesthesia Other Findings Past Medical History: No date: GERD (gastroesophageal reflux disease) No date: Patient denies medical problems   Reproductive/Obstetrics negative OB ROS                              Anesthesia Physical Anesthesia Plan  ASA: 2  Anesthesia Plan: General   Post-op Pain Management:    Induction: Intravenous  PONV Risk Score and Plan: 2 and Ondansetron , Dexamethasone  and Treatment may vary due to age or medical condition  Airway Management Planned: Oral ETT and LMA  Additional Equipment:   Intra-op Plan:   Post-operative Plan: Extubation in OR  Informed Consent: I have reviewed the patients History and Physical, chart, labs and discussed the procedure including the risks, benefits and alternatives for the proposed anesthesia with the patient or authorized representative who has indicated his/her understanding and acceptance.      Dental Advisory Given  Plan Discussed with: Anesthesiologist, CRNA and Surgeon  Anesthesia Plan Comments:         Anesthesia Quick Evaluation

## 2023-12-19 NOTE — Transfer of Care (Signed)
 Immediate Anesthesia Transfer of Care Note  Patient: Joseph Hodges, Joseph Hodges  Procedure(s) Performed: REPAIR, TENDON, QUADRICEPS (Right: Knee)  Patient Location: PACU  Anesthesia Type:General  Level of Consciousness: sedated  Airway & Oxygen Therapy: Patient Spontanous Breathing and Patient connected to face mask oxygen  Post-op Assessment: Report given to RN and Post -op Vital signs reviewed and stable  Post vital signs: Reviewed and stable  Last Vitals:  Vitals Value Taken Time  BP 133/76 12/19/23 14:30  Temp    Pulse 30 12/19/23 14:32  Resp 25 12/19/23 14:32  SpO2 96 % 12/19/23 14:32  Vitals shown include unfiled device data.  Last Pain:  Vitals:   12/19/23 1043  TempSrc: Temporal  PainSc: 0-No pain         Complications: No notable events documented.

## 2023-12-20 ENCOUNTER — Encounter: Payer: Self-pay | Admitting: Orthopedic Surgery

## 2023-12-21 NOTE — Anesthesia Postprocedure Evaluation (Signed)
 Anesthesia Post Note  Patient: Development Worker, Community  Procedure(s) Performed: REPAIR, TENDON, QUADRICEPS (Right: Knee)  Patient location during evaluation: PACU Anesthesia Type: General Level of consciousness: awake and alert Pain management: pain level controlled Vital Signs Assessment: post-procedure vital signs reviewed and stable Respiratory status: spontaneous breathing, nonlabored ventilation, respiratory function stable and patient connected to nasal cannula oxygen Cardiovascular status: blood pressure returned to baseline and stable Postop Assessment: no apparent nausea or vomiting Anesthetic complications: no   No notable events documented.   Last Vitals:  Vitals:   12/19/23 1500 12/19/23 1526  BP: (!) 140/69 133/83  Pulse: 77 (!) 56  Resp: 19 18  Temp: 36.6 C 36.5 C  SpO2: 100% 96%    Last Pain:  Vitals:   12/19/23 1526  TempSrc: Temporal  PainSc:                  Prentice Murphy
